# Patient Record
Sex: Female | Born: 1965
Health system: Southern US, Community
[De-identification: ages and names within clinical notes are randomized; demographics above are authoritative.]

## PROBLEM LIST (undated history)

## (undated) DIAGNOSIS — F909 Attention-deficit hyperactivity disorder, unspecified type: Secondary | ICD-10-CM

## (undated) DIAGNOSIS — M5126 Other intervertebral disc displacement, lumbar region: Secondary | ICD-10-CM

## (undated) HISTORY — PX: KIDNEY DONATION: SHX685

## (undated) HISTORY — DX: Other intervertebral disc displacement, lumbar region: M51.26

---

## 2000-04-18 ENCOUNTER — Other Ambulatory Visit: Admission: RE | Admit: 2000-04-18 | Discharge: 2000-04-18 | Payer: Self-pay | Admitting: Obstetrics and Gynecology

## 2000-10-30 ENCOUNTER — Inpatient Hospital Stay (HOSPITAL_COMMUNITY): Admission: AD | Admit: 2000-10-30 | Discharge: 2000-10-30 | Payer: Self-pay | Admitting: Obstetrics and Gynecology

## 2000-11-30 ENCOUNTER — Inpatient Hospital Stay (HOSPITAL_COMMUNITY): Admission: AD | Admit: 2000-11-30 | Discharge: 2000-12-02 | Payer: Self-pay | Admitting: Obstetrics & Gynecology

## 2001-01-23 ENCOUNTER — Ambulatory Visit: Admission: AD | Admit: 2001-01-23 | Discharge: 2001-01-23 | Payer: Self-pay | Admitting: Obstetrics & Gynecology

## 2002-03-20 ENCOUNTER — Other Ambulatory Visit: Admission: RE | Admit: 2002-03-20 | Discharge: 2002-03-20 | Payer: Self-pay | Admitting: Obstetrics and Gynecology

## 2003-01-06 ENCOUNTER — Emergency Department (HOSPITAL_COMMUNITY): Admission: EM | Admit: 2003-01-06 | Discharge: 2003-01-06 | Payer: Self-pay | Admitting: *Deleted

## 2003-01-06 ENCOUNTER — Encounter: Payer: Self-pay | Admitting: *Deleted

## 2003-01-07 ENCOUNTER — Encounter: Payer: Self-pay | Admitting: Orthopaedic Surgery

## 2003-01-07 ENCOUNTER — Ambulatory Visit (HOSPITAL_COMMUNITY): Admission: RE | Admit: 2003-01-07 | Discharge: 2003-01-07 | Payer: Self-pay | Admitting: Orthopaedic Surgery

## 2004-10-29 ENCOUNTER — Emergency Department (HOSPITAL_COMMUNITY): Admission: EM | Admit: 2004-10-29 | Discharge: 2004-10-29 | Payer: Self-pay | Admitting: Emergency Medicine

## 2007-02-20 ENCOUNTER — Emergency Department (HOSPITAL_COMMUNITY): Admission: EM | Admit: 2007-02-20 | Discharge: 2007-02-20 | Payer: Self-pay | Admitting: Emergency Medicine

## 2009-07-22 ENCOUNTER — Other Ambulatory Visit: Admission: RE | Admit: 2009-07-22 | Discharge: 2009-07-22 | Payer: Self-pay | Admitting: Obstetrics and Gynecology

## 2009-12-23 ENCOUNTER — Ambulatory Visit (HOSPITAL_COMMUNITY): Admission: RE | Admit: 2009-12-23 | Discharge: 2009-12-23 | Payer: Self-pay | Admitting: Advanced Practice Midwife

## 2010-12-04 NOTE — Op Note (Signed)
[facility]  Patient:    Caitlin Cooper, Caitlin Cooper                      MRN: [id number] Proc. Date: 01/23/01 Adm. Date:  [id number] Attending:  Lars Pinks                           Operative Report  PREOPERATIVE DIAGNOSIS:       Voluntary sterilization.  POSTOPERATIVE DIAGNOSIS:      Voluntary sterilization.  OPERATION:                    Laparoscopic bilateral tubal cautery.  SURGEON:                      Richard D. Arlyce Dice, M.D.  ANESTHESIA:                   General endotracheal anesthesia.  ESTIMATED BLOOD LOSS:         20 cc.  FINDINGS:                     Normal-appearing tubes and ovaries, normal uterus, normal cul-de-sac.  No evidence of endometriosis or pelvic adhesions. There were some adhesions in the right lower quadrant involving the small bowel.  The appendix could not be visualized.  INDICATIONS:                  This is a 45 year old, gravida 3, para 2, who delivered spontaneously in May and elected to have permanent sterilization. The options of reversible birth control were discussed with the patient as well as the failure rate of 5:1000.  The patient elected to proceed.  DESCRIPTION OF PROCEDURE:     The patient was taken to the operating room, placed in supine position, and general endotracheal anesthesia was induced. She was placed in the modified dorsolithotomy position, and the abdomen and vagina were prepped and draped in a sterile fashion.  The bladder was emptied. An attempt was made to place the Veress needle in the cul-de-sac to create a pneumoperitoneum, but this failed.  The surgeon regloved, and the Veress needle was placed into the umbilicus into the peritoneal cavity, and pneumoperitoneum was created easily.  Following this, a 5 mm trocar was introduced through the umbilicus, and a 5 mm scope was then place under direct visualization, and 5 mm accessory instrument was placed through a suprapubic stab wound.   The pelvis was viewed with the findings noted above.  The Kleppinger forceps were used to grasp the fallopian tube, and it was cauterized along a 3 to 4 cm length; 1.5 cm of normal tube was left proximal to the burn site.  The identical procedure was then carried out on the contralateral tube.   The procedure was then terminated.  Gas was allowed to escape.  The suprapubic wound was closed with Steri-Strips, and the umbilical wound was left unsutured.  The patient tolerated the procedure well and left the operating room in good condition. DD:  01/23/01 TD:  01/23/01 Job: [id number]

## 2012-01-26 ENCOUNTER — Other Ambulatory Visit (HOSPITAL_COMMUNITY)
Admission: RE | Admit: 2012-01-26 | Discharge: 2012-01-26 | Disposition: A | Discharge status: Home / Self Care | Payer: BC Managed Care – PPO | Source: Ambulatory Visit | Attending: Family Medicine | Admitting: Family Medicine

## 2012-01-26 ENCOUNTER — Other Ambulatory Visit: Payer: Self-pay | Admitting: Family Medicine

## 2012-01-26 DIAGNOSIS — Z124 Encounter for screening for malignant neoplasm of cervix: Secondary | ICD-10-CM | POA: Insufficient documentation

## 2012-01-26 DIAGNOSIS — Z113 Encounter for screening for infections with a predominantly sexual mode of transmission: Secondary | ICD-10-CM | POA: Insufficient documentation

## 2013-02-02 ENCOUNTER — Other Ambulatory Visit: Payer: Self-pay | Admitting: Family Medicine

## 2013-02-02 DIAGNOSIS — Z1231 Encounter for screening mammogram for malignant neoplasm of breast: Secondary | ICD-10-CM

## 2013-02-23 ENCOUNTER — Ambulatory Visit
Admission: RE | Admit: 2013-02-23 | Discharge: 2013-02-23 | Disposition: A | Discharge status: Home / Self Care | Payer: Self-pay | Source: Ambulatory Visit | Attending: Family Medicine | Admitting: Family Medicine

## 2013-02-23 DIAGNOSIS — Z1231 Encounter for screening mammogram for malignant neoplasm of breast: Secondary | ICD-10-CM

## 2013-02-26 ENCOUNTER — Other Ambulatory Visit: Payer: Self-pay | Admitting: Family Medicine

## 2013-02-26 DIAGNOSIS — R928 Other abnormal and inconclusive findings on diagnostic imaging of breast: Secondary | ICD-10-CM

## 2013-03-05 ENCOUNTER — Ambulatory Visit
Admission: RE | Admit: 2013-03-05 | Discharge: 2013-03-05 | Disposition: A | Discharge status: Home / Self Care | Payer: BC Managed Care – PPO | Source: Ambulatory Visit | Attending: Family Medicine | Admitting: Family Medicine

## 2013-03-05 DIAGNOSIS — R928 Other abnormal and inconclusive findings on diagnostic imaging of breast: Secondary | ICD-10-CM

## 2013-08-13 ENCOUNTER — Other Ambulatory Visit: Payer: Self-pay | Admitting: Family Medicine

## 2013-08-13 DIAGNOSIS — N63 Unspecified lump in unspecified breast: Secondary | ICD-10-CM

## 2013-09-06 ENCOUNTER — Inpatient Hospital Stay: Admission: RE | Admit: 2013-09-06 | Payer: BC Managed Care – PPO | Source: Ambulatory Visit

## 2013-09-11 ENCOUNTER — Other Ambulatory Visit: Payer: BC Managed Care – PPO

## 2013-09-17 ENCOUNTER — Ambulatory Visit
Admission: RE | Admit: 2013-09-17 | Discharge: 2013-09-17 | Disposition: A | Discharge status: Home / Self Care | Payer: BC Managed Care – PPO | Source: Ambulatory Visit | Attending: Family Medicine | Admitting: Family Medicine

## 2013-09-17 DIAGNOSIS — N63 Unspecified lump in unspecified breast: Secondary | ICD-10-CM

## 2014-01-29 ENCOUNTER — Emergency Department (HOSPITAL_COMMUNITY)
Admission: EM | Admit: 2014-01-29 | Discharge: 2014-01-29 | Disposition: A | Discharge status: Home / Self Care | Payer: BC Managed Care – PPO | Attending: Emergency Medicine | Admitting: Emergency Medicine

## 2014-01-29 ENCOUNTER — Encounter (HOSPITAL_COMMUNITY): Payer: Self-pay | Admitting: Emergency Medicine

## 2014-01-29 DIAGNOSIS — Z91040 Latex allergy status: Secondary | ICD-10-CM | POA: Insufficient documentation

## 2014-01-29 DIAGNOSIS — F909 Attention-deficit hyperactivity disorder, unspecified type: Secondary | ICD-10-CM | POA: Insufficient documentation

## 2014-01-29 DIAGNOSIS — M545 Low back pain, unspecified: Secondary | ICD-10-CM

## 2014-01-29 DIAGNOSIS — Z791 Long term (current) use of non-steroidal anti-inflammatories (NSAID): Secondary | ICD-10-CM | POA: Diagnosis not present

## 2014-01-29 DIAGNOSIS — Z79899 Other long term (current) drug therapy: Secondary | ICD-10-CM | POA: Diagnosis not present

## 2014-01-29 HISTORY — DX: Attention-deficit hyperactivity disorder, unspecified type: F90.9

## 2014-01-29 LAB — CBG MONITORING, ED: GLUCOSE-CAPILLARY: 134 mg/dL — AB (ref 70–99)

## 2014-01-29 MED ORDER — SODIUM CHLORIDE 0.9 % IV SOLN
Freq: Once | INTRAVENOUS | Status: AC
  Administered 2014-01-29: 08:00:00 via INTRAVENOUS

## 2014-01-29 MED ORDER — DIAZEPAM 5 MG PO TABS: ORAL_TABLET | ORAL | Status: DC

## 2014-01-29 MED ORDER — DIAZEPAM 5 MG PO TABS
5.0000 mg | ORAL_TABLET | Freq: Once | ORAL | Status: AC
  Administered 2014-01-29: 5 mg via ORAL
  Filled 2014-01-29: qty 1

## 2014-01-29 MED ORDER — IBUPROFEN 800 MG PO TABS: 800.0000 mg | ORAL_TABLET | Freq: Three times a day (TID) | ORAL | Status: DC

## 2014-01-29 MED ORDER — HYDROCODONE-ACETAMINOPHEN 5-325 MG PO TABS: 1.0000 | ORAL_TABLET | ORAL | Status: DC | PRN

## 2014-01-29 NOTE — ED Notes (Signed)
Pt called for help to assist pt from chair to bed.  Reports lower back spasms. Nehemiah Settle, [place] aware

## 2014-01-29 NOTE — ED Provider Notes (Signed)
CSN: [id number]     Arrival date & time 01/29/14  0802 History   First MD Initiated Contact with Patient 01/29/14 [phone]     Chief Complaint  Patient presents with  . Back Pain     (Consider location/radiation/quality/duration/timing/severity/associated sxs/prior Treatment) Patient is a 48 y.o. female presenting with back pain. The history is provided by the patient. No language interpreter was used.  Back Pain Location:  Lumbar spine Radiates to:  Does not radiate Pain severity:  Moderate Associated symptoms: no abdominal pain, no fever, no headaches, no numbness and no weakness   Associated symptoms comment:  48yo female presents with low back pain which started on Thursday after playing lacrosse earlier in the week.  Pain has become more severe since Thursday.  Pain is described as a sharp, shooting, and now constant pain.  It is made worse with movement, coughing, and inspiration.  Pain does cause nausea.  Pt tried both Aleve, Advil, and heat with no relief.  Pain was improved some with ice.  10/10 on pain scale but is currently improving with pain medication.  Hx of bulging disc x 15 yrs but pt states this does not cause chronic pain.   Pt describes a tingling sensation in her bilateral  lower extremities but denies loss of sensation.  Denies loss of bowel or bladder function.  Denies chest, abd pain, and vomiting.        Past Medical History  Diagnosis Date  . ADHD (attention deficit hyperactivity disorder)    History reviewed. No pertinent past surgical history. History reviewed. No pertinent family history. History  Substance Use Topics  . Smoking status: Never Smoker   . Smokeless tobacco: Not on file  . Alcohol Use: No   OB History   Grav Para Term Preterm Abortions TAB SAB Ect Mult Living                 Review of Systems  Constitutional: Negative for fever and chills.  Respiratory: Negative.   Cardiovascular: Negative.   Gastrointestinal: Negative.  Negative for  nausea and abdominal pain.  Genitourinary: Negative.   Musculoskeletal: Positive for back pain.  Skin: Negative.   Neurological: Negative.  Negative for weakness, numbness and headaches.      Allergies  Latex  Home Medications   Prior to Admission medications   Medication Sig Start Date End Date Taking? Authorizing Provider  Elderberry 575 MG/5ML SYRP Take 15-30 mLs by mouth every 4 (four) hours as needed (for cough).   Yes Historical Provider, MD  ibuprofen (ADVIL,MOTRIN) 200 MG tablet Take 600 mg by mouth every 6 (six) hours as needed for moderate pain.   Yes Historical Provider, MD  lisdexamfetamine (VYVANSE) 30 MG capsule Take 30 mg by mouth daily.   Yes Historical Provider, MD  naproxen sodium (ANAPROX) 220 MG tablet Take 440 mg by mouth daily as needed (for pain).   Yes Historical Provider, MD   BP 128/74  Pulse 76  Temp(Src) 97.7 F (36.5 C) (Oral)  Resp 16  Ht 5\' 3"  (1.6 m)  Wt 181 lb (82.101 kg)  BMI 32.07 kg/m2  SpO2 100%  LMP 01/15/2014 Physical Exam  Constitutional: She is oriented to person, place, and time. She appears well-developed and well-nourished.  HENT:  Head: Normocephalic.  Neck: Normal range of motion. Neck supple.  Cardiovascular: Normal rate and regular rhythm.   Pulmonary/Chest: Effort normal and breath sounds normal.  Abdominal: Soft. Bowel sounds are normal. There is no tenderness. There is  no rebound and no guarding.  Musculoskeletal: Normal range of motion.  Mild lumbar and paralumbar tenderness without swelling or discoloration.  Neurological: She is alert and oriented to person, place, and time. She has normal reflexes. Coordination normal.  Skin: Skin is warm and dry. No rash noted.  Psychiatric: She has a normal mood and affect.    ED Course  Procedures (including critical care time) Labs Review Labs Reviewed  CBG MONITORING, ED - Abnormal; Notable for the following:    Glucose-Capillary 134 (*)    All other components within  normal limits    Imaging Review No results found.   EKG Interpretation None      MDM   Final diagnoses:  None    1. Low back pain  Pain is worse with movement, better with rest. Occurred after physical activity and worsened over time to become what is described as spasmodic pain. No neurologic red flags or deficits. Better with medication here. She is able to ambulate with assistance and with subjectively less pain. Stable for discharge.     Dewaine Oats, PA-C 01/29/14 1055

## 2014-01-29 NOTE — ED Notes (Signed)
Shari, PA at bedside. 

## 2014-01-29 NOTE — ED Notes (Signed)
Pt to department via EMS- pt reports a hx of bulging disc. States that last week she was playing outside with her daughter and hurt her back. States that she went to the MD yesterday and was feeling better. States that this morning that she was unable to move or sit up. Bp-114/70 Hr-60 164mcg fentanyl, 4mg  zofran. 20g right hand.

## 2014-01-29 NOTE — Discharge Instructions (Signed)
Back Injury Prevention Back injuries can be extremely painful and difficult to heal. After having one back injury, you are much more likely to experience another later on. It is important to learn how to avoid injuring or re-injuring your back. The following tips can help you to prevent a back injury. PHYSICAL FITNESS  Exercise regularly and try to develop good tone in your abdominal muscles. Your abdominal muscles provide a lot of the support needed by your back.  Do aerobic exercises (walking, jogging, biking, swimming) regularly.  Do exercises that increase balance and strength (tai chi, yoga) regularly. This can decrease your risk of falling and injuring your back.  Stretch before and after exercising.  Maintain a healthy weight. The more you weigh, the more stress is placed on your back. For every pound of weight, 10 times that amount of pressure is placed on the back. DIET  Talk to your caregiver about how much calcium and vitamin D you need per day. These nutrients help to prevent weakening of the bones (osteoporosis). Osteoporosis can cause broken (fractured) bones that lead to back pain.  Include good sources of calcium in your diet, such as dairy products, green, leafy vegetables, and products with calcium added (fortified).  Include good sources of vitamin D in your diet, such as milk and foods that are fortified with vitamin D.  Consider taking a nutritional supplement or a multivitamin if needed.  Stop smoking if you smoke. POSTURE  Sit and stand up straight. Avoid leaning forward when you sit or hunching over when you stand.  Choose chairs with good low back (lumbar) support.  If you work at a desk, sit close to your work so you do not need to lean over. Keep your chin tucked in. Keep your neck drawn back and elbows bent at a right angle. Your arms should look like the letter "L."  Sit high and close to the steering wheel when you drive. Add a lumbar support to your car  seat if needed.  Avoid sitting or standing in one position for too long. Take breaks to get up, stretch, and walk around at least once every hour. Take breaks if you are driving for long periods of time.  Sleep on your side with your knees slightly bent, or sleep on your back with a pillow under your knees. Do not sleep on your stomach. LIFTING, TWISTING, AND REACHING  Avoid heavy lifting, especially repetitive lifting. If you must do heavy lifting:  Stretch before lifting.  Work slowly.  Rest between lifts.  Use carts and dollies to move objects when possible.  Make several small trips instead of carrying 1 heavy load.  Ask for help when you need it.  Ask for help when moving big, awkward objects.  Follow these steps when lifting:  Stand with your feet shoulder-width apart.  Get as close to the object as you can. Do not try to pick up heavy objects that are far from your body.  Use handles or lifting straps if they are available.  Bend at your knees. Squat down, but keep your heels off the floor.  Keep your shoulders pulled back, your chin tucked in, and your back straight.  Lift the object slowly, tightening the muscles in your legs, abdomen, and buttocks. Keep the object as close to the center of your body as possible.  When you put a load down, use these same guidelines in reverse.  Do not:  Lift the object above your waist.  Twist at the waist while lifting or carrying a load. Move your feet if you need to turn, not your waist.  Bend over without bending at your knees.  Avoid reaching over your head, across a table, or for an object on a high surface. OTHER TIPS  Avoid wet floors and keep sidewalks clear of ice to prevent falls.  Do not sleep on a mattress that is too soft or too hard.  Keep items that are used frequently within easy reach.  Put heavier objects on shelves at waist level and lighter objects on lower or higher shelves.  Find ways to  decrease your stress, such as exercise, massage, or relaxation techniques. Stress can build up in your muscles. Tense muscles are more vulnerable to injury.  Seek treatment for depression or anxiety if needed. These conditions can increase your risk of developing back pain. SEEK MEDICAL CARE IF:  You injure your back.  You have questions about diet, exercise, or other ways to prevent back injuries. MAKE SURE YOU:  Understand these instructions.  Will watch your condition.  Will get help right away if you are not doing well or get worse. Document Released: 08/12/2004 Document Revised: 09/27/2011 Document Reviewed: 08/16/2011 [facility] Patient Information 2015 [place], [place]. This information is not intended to replace advice given to you by your health care provider. Make sure you discuss any questions you have with your health care provider. Muscle Cramps and Spasms Muscle cramps and spasms occur when a muscle or muscles tighten and you have no control over this tightening (involuntary muscle contraction). They are a common problem and can develop in any muscle. The most common place is in the calf muscles of the leg. Both muscle cramps and muscle spasms are involuntary muscle contractions, but they also have differences:   Muscle cramps are sporadic and painful. They may last a few seconds to a quarter of an hour. Muscle cramps are often more forceful and last longer than muscle spasms.  Muscle spasms may or may not be painful. They may also last just a few seconds or much longer. CAUSES  It is uncommon for cramps or spasms to be due to a serious underlying problem. In many cases, the cause of cramps or spasms is unknown. Some common causes are:   Overexertion.   Overuse from repetitive motions (doing the same thing over and over).   Remaining in a certain position for a long period of time.   Improper preparation, form, or technique while performing a sport or activity.    Dehydration.   Injury.   Side effects of some medicines.   Abnormally low levels of the salts and ions in your blood (electrolytes), especially potassium and calcium. This could happen if you are taking water pills (diuretics) or you are pregnant.  Some underlying medical problems can make it more likely to develop cramps or spasms. These include, but are not limited to:   Diabetes.   Parkinson disease.   Hormone disorders, such as thyroid problems.   Alcohol abuse.   Diseases specific to muscles, joints, and bones.   Blood vessel disease where not enough blood is getting to the muscles.  HOME CARE INSTRUCTIONS   Stay well hydrated. Drink enough water and fluids to keep your urine clear or pale yellow.  It may be helpful to massage, stretch, and relax the affected muscle.  For tight or tense muscles, use a warm towel, heating pad, or hot shower water directed to the affected area.  If you are sore or have pain after a cramp or spasm, applying ice to the affected area may relieve discomfort.  Put ice in a plastic bag.  Place a towel between your skin and the bag.  Leave the ice on for 15-20 minutes, 03-04 times a day.  Medicines used to treat a known cause of cramps or spasms may help reduce their frequency or severity. Only take over-the-counter or prescription medicines as directed by your caregiver. SEEK MEDICAL CARE IF:  Your cramps or spasms get more severe, more frequent, or do not improve over time.  MAKE SURE YOU:   Understand these instructions.  Will watch your condition.  Will get help right away if you are not doing well or get worse. Document Released: 12/25/2001 Document Revised: 10/30/2012 Document Reviewed: 06/21/2012 [facility] Patient Information 2015 [place], [place]. This information is not intended to replace advice given to you by your health care provider. Make sure you discuss any questions you have with your health care provider.

## 2014-01-29 NOTE — ED Notes (Addendum)
Pt assisted to side of bed first; then assisted to standing position with assistance.  Pt denies complaints. Sts feels better now after fluids. Pt ambulated to restroom with steady gait. Caitlin Cooper, [place] aware

## 2014-01-30 NOTE — ED Provider Notes (Signed)
Medical screening examination/treatment/procedure(s) were performed by non-physician practitioner and as supervising physician I was immediately available for consultation/collaboration.   EKG Interpretation None        Ephraim Hamburger, MD 01/30/14 1553

## 2015-06-12 ENCOUNTER — Other Ambulatory Visit: Payer: Self-pay | Admitting: Family Medicine

## 2015-06-12 ENCOUNTER — Other Ambulatory Visit (HOSPITAL_COMMUNITY)
Admission: RE | Admit: 2015-06-12 | Discharge: 2015-06-12 | Disposition: A | Discharge status: Home / Self Care | Payer: 59 | Source: Ambulatory Visit | Attending: Family Medicine | Admitting: Family Medicine

## 2015-06-12 DIAGNOSIS — Z1151 Encounter for screening for human papillomavirus (HPV): Secondary | ICD-10-CM | POA: Diagnosis present

## 2015-06-12 DIAGNOSIS — Z124 Encounter for screening for malignant neoplasm of cervix: Secondary | ICD-10-CM | POA: Insufficient documentation

## 2015-06-17 LAB — CYTOLOGY - PAP

## 2016-02-17 ENCOUNTER — Emergency Department (HOSPITAL_COMMUNITY): Payer: Medicaid Other

## 2016-02-17 ENCOUNTER — Emergency Department (HOSPITAL_COMMUNITY)
Admission: EM | Admit: 2016-02-17 | Discharge: 2016-02-17 | Disposition: A | Discharge status: Home / Self Care | Payer: Medicaid Other | Attending: Emergency Medicine | Admitting: Emergency Medicine

## 2016-02-17 ENCOUNTER — Encounter (HOSPITAL_COMMUNITY): Payer: Self-pay | Admitting: Emergency Medicine

## 2016-02-17 DIAGNOSIS — Z91040 Latex allergy status: Secondary | ICD-10-CM | POA: Diagnosis not present

## 2016-02-17 DIAGNOSIS — M549 Dorsalgia, unspecified: Secondary | ICD-10-CM

## 2016-02-17 DIAGNOSIS — M545 Low back pain: Secondary | ICD-10-CM | POA: Diagnosis present

## 2016-02-17 DIAGNOSIS — M5442 Lumbago with sciatica, left side: Secondary | ICD-10-CM

## 2016-02-17 DIAGNOSIS — Z791 Long term (current) use of non-steroidal anti-inflammatories (NSAID): Secondary | ICD-10-CM | POA: Insufficient documentation

## 2016-02-17 LAB — BASIC METABOLIC PANEL
Anion gap: 6 (ref 5–15)
BUN: 13 mg/dL (ref 6–20)
CALCIUM: 8.9 mg/dL (ref 8.9–10.3)
CO2: 25 mmol/L (ref 22–32)
CREATININE: 0.91 mg/dL (ref 0.44–1.00)
Chloride: 107 mmol/L (ref 101–111)
GFR calc Af Amer: >60 mL/min (ref >60)
GFR calc non Af Amer: >60 mL/min (ref >60)
GLUCOSE: 97 mg/dL (ref 65–99)
Potassium: 3.8 mmol/L (ref 3.5–5.1)
Sodium: 138 mmol/L (ref 135–145)

## 2016-02-17 LAB — CBC WITH DIFFERENTIAL/PLATELET
BASOS PCT: 1 %
Basophils Absolute: 0.1 10*3/uL (ref 0.0–0.1)
EOS ABS: 0 10*3/uL (ref 0.0–0.7)
Eosinophils Relative: 0 %
HCT: 31.1 % — ABNORMAL LOW (ref 36.0–46.0)
HEMOGLOBIN: 9.5 g/dL — AB (ref 12.0–15.0)
Lymphocytes Relative: 17 %
Lymphs Abs: 0.9 10*3/uL (ref 0.7–4.0)
MCH: 22.4 pg — AB (ref 26.0–34.0)
MCHC: 30.5 g/dL (ref 30.0–36.0)
MCV: 73.3 fL — AB (ref 78.0–100.0)
MONO ABS: 0.3 10*3/uL (ref 0.1–1.0)
Monocytes Relative: 6 %
Neutro Abs: 4.2 10*3/uL (ref 1.7–7.7)
Neutrophils Relative %: 76 %
PLATELETS: 422 10*3/uL — AB (ref 150–400)
RBC: 4.24 MIL/uL (ref 3.87–5.11)
RDW: 16.2 % — ABNORMAL HIGH (ref 11.5–15.5)
WBC: 5.5 10*3/uL (ref 4.0–10.5)

## 2016-02-17 LAB — URINALYSIS, ROUTINE W REFLEX MICROSCOPIC
BILIRUBIN URINE: NEGATIVE
Glucose, UA: NEGATIVE mg/dL
HGB URINE DIPSTICK: NEGATIVE
Ketones, ur: 15 mg/dL — AB
Leukocytes, UA: NEGATIVE
Nitrite: NEGATIVE
PROTEIN: NEGATIVE mg/dL
SPECIFIC GRAVITY, URINE: 1.009 (ref 1.005–1.030)
pH: 7.5 (ref 5.0–8.0)

## 2016-02-17 MED ORDER — MORPHINE SULFATE (PF) 4 MG/ML IV SOLN
4.0000 mg | Freq: Once | INTRAVENOUS | Status: AC
  Administered 2016-02-17: 4 mg via INTRAVENOUS
  Filled 2016-02-17: qty 1

## 2016-02-17 MED ORDER — METHOCARBAMOL 500 MG PO TABS: 500.0000 mg | ORAL_TABLET | Freq: Two times a day (BID) | ORAL | 0 refills | Status: DC | PRN

## 2016-02-17 MED ORDER — KETOROLAC TROMETHAMINE 30 MG/ML IJ SOLN
30.0000 mg | Freq: Once | INTRAMUSCULAR | Status: AC
  Administered 2016-02-17: 30 mg via INTRAVENOUS
  Filled 2016-02-17: qty 1

## 2016-02-17 MED ORDER — PREDNISONE 20 MG PO TABS: 40.0000 mg | ORAL_TABLET | Freq: Every day | ORAL | 0 refills | Status: DC

## 2016-02-17 MED ORDER — ONDANSETRON HCL 4 MG/2ML IJ SOLN
4.0000 mg | Freq: Once | INTRAMUSCULAR | Status: AC
  Administered 2016-02-17: 4 mg via INTRAVENOUS
  Filled 2016-02-17: qty 2

## 2016-02-17 MED ORDER — NAPROXEN 500 MG PO TABS
500.0000 mg | ORAL_TABLET | Freq: Two times a day (BID) | ORAL | 0 refills | Status: DC
Start: 2016-02-17 — End: 2017-10-13

## 2016-02-17 MED ORDER — DIAZEPAM 5 MG/ML IJ SOLN
5.0000 mg | Freq: Once | INTRAMUSCULAR | Status: AC
  Administered 2016-02-17: 5 mg via INTRAVENOUS
  Filled 2016-02-17: qty 2

## 2016-02-17 NOTE — ED Notes (Signed)
Bed: [facility] Expected date:  Expected time:  Means of arrival:  Comments: 50 yo F, Back Pain

## 2016-02-17 NOTE — ED Provider Notes (Signed)
[facility] DEPT Provider Note   CSN: [id number] Arrival date & time: 02/17/16  1437  First Provider Contact:  First MD Initiated Contact with Patient 02/17/16 1505        History   Chief Complaint Chief Complaint  Patient presents with  . Back Pain    HPI DENNIS LUGAR is a 50 y.o. female.  ORIA MAYERNIK is a 50 y.o. Female who presents to the ED via EMS complaining of low back pain that has worsened today. Patient reports she has a history of L4-5 herniated disk and usually has some back pain. She usually take ibuprofen for her pain. She reports today she was in the shower and reaching up when she began having worsening pain to her lumbar spine. She reports her pain is across her lumbar spine and can radiate down her posterior left leg. She took ibuprofen early this morning. Should 50 g of fentanyl via IV by EMS in route to the hospital. She reports her pain is improved by holding still. She reports if she moves her pain worsens. Patient denies fevers, urinary symptoms, hematuria, numbness, tingling, weakness, history of IV drug use, history of cancer, loss of bladder control, loss of bowel control, or falls.   The history is provided by the patient and medical records. No language interpreter was used.  Back Pain      Past Medical History:  Diagnosis Date  . ADHD (attention deficit hyperactivity disorder)     There are no active problems to display for this patient.   History reviewed. No pertinent surgical history.  OB History    No data available       Home Medications    Prior to Admission medications   Medication Sig Start Date End Date Taking? Authorizing Provider  ibuprofen (ADVIL,MOTRIN) 200 MG tablet Take 800 mg by mouth every 6 (six) hours as needed for moderate pain.    Yes Historical Provider, MD  VYVANSE 40 MG capsule Take 40 mg by mouth daily as needed (ADD/ADHD.).  11/19/15  Yes Historical Provider, MD  methocarbamol (ROBAXIN) 500 MG tablet Take  1 tablet (500 mg total) by mouth 2 (two) times daily as needed for muscle spasms. 02/17/16   Waynetta Pean, PA-C  naproxen (NAPROSYN) 500 MG tablet Take 1 tablet (500 mg total) by mouth 2 (two) times daily with a meal. 02/17/16   Waynetta Pean, PA-C  predniSONE (DELTASONE) 20 MG tablet Take 2 tablets (40 mg total) by mouth daily. 02/17/16   Waynetta Pean, PA-C    Family History No family history on file.  Social History Social History  Substance Use Topics  . Smoking status: Never Smoker  . Smokeless tobacco: Not on file  . Alcohol use No     Allergies   Latex   Review of Systems Review of Systems  Constitutional: Negative for chills and fever.  HENT: Negative for sore throat.   Eyes: Negative for visual disturbance.  Respiratory: Negative for cough and shortness of breath.   Cardiovascular: Negative for chest pain.  Gastrointestinal: Negative for abdominal pain, nausea and vomiting.  Genitourinary: Negative for difficulty urinating, dysuria, frequency and urgency.  Musculoskeletal: Positive for back pain. Negative for neck pain.  Skin: Negative for rash.  Neurological: Negative for weakness, numbness and headaches.     Physical Exam Updated Vital Signs BP 137/78 (BP Location: Left Arm)   Pulse 70   Temp 98.1 F (36.7 C) (Oral)   Resp 20   SpO2 98%  Physical Exam  Constitutional: She is oriented to person, place, and time. She appears well-developed and well-nourished. No distress.  Nontoxic appearing.  HENT:  Head: Normocephalic and atraumatic.  Right Ear: External ear normal.  Left Ear: External ear normal.  Eyes: Conjunctivae are normal. Pupils are equal, round, and reactive to light. Right eye exhibits no discharge. Left eye exhibits no discharge.  Neck: Neck supple.  Cardiovascular: Normal rate, regular rhythm, normal heart sounds and intact distal pulses.   Bilateral dorsalis pedis and posterior tibialis pulses are intact.  Pulmonary/Chest: Effort normal  and breath sounds normal. No respiratory distress.  Abdominal: Soft. There is no tenderness.  Musculoskeletal: Normal range of motion. She exhibits tenderness. She exhibits no edema or deformity.  Patient has tenderness across her lumbar spine. No midline back tenderness. No overlying skin changes. No back erythema, deformity, ecchymosis, edema or warmth. No LE edema or tenderness.  Lymphadenopathy:    She has no cervical adenopathy.  Neurological: She is alert and oriented to person, place, and time. She has normal reflexes. She displays normal reflexes. She exhibits normal muscle tone. Coordination normal.  Sensation is intact to her bilateral LE.  Bilateral patellar DTRs are intact. After pain control patient is able to ambulate with normal gait.  Skin: Skin is warm and dry. Capillary refill takes less than 2 seconds. No rash noted. She is not diaphoretic.  Psychiatric: She has a normal mood and affect. Her behavior is normal.  Nursing note and vitals reviewed.    ED Treatments / Results  Labs (all labs ordered are listed, but only abnormal results are displayed) Labs Reviewed  CBC WITH DIFFERENTIAL/PLATELET - Abnormal; Notable for the following:       Result Value   Hemoglobin 9.5 (*)    HCT 31.1 (*)    MCV 73.3 (*)    MCH 22.4 (*)    RDW 16.2 (*)    Platelets 422 (*)    All other components within normal limits  URINALYSIS, ROUTINE W REFLEX MICROSCOPIC (NOT AT [facility]) - Abnormal; Notable for the following:    Ketones, ur 15 (*)    All other components within normal limits  BASIC METABOLIC PANEL    EKG  EKG Interpretation None       Radiology Mr Lumbar Spine Wo Contrast  Result Date: 02/17/2016 CLINICAL DATA:  50 year old female with back pain. Initial encounter. EXAM: MRI LUMBAR SPINE WITHOUT CONTRAST TECHNIQUE: Multiplanar, multisequence MR imaging of the lumbar spine was performed. No intravenous contrast was administered. COMPARISON:  10/29/2004 plain film exam. No  comparison MR available for direct review. Report from 01/07/2003 MR is available. FINDINGS: Exam is motion degraded. Segmentation: Last fully open disk space is labeled L5-S1. Present examination incorporates from T11-12 disc space through the mid to lower sacrum. Alignment:  Normal. Vertebrae: Small hemangioma/ focal fatty deposit right L1 and left L3 vertebra. Remodeling of the S2 sacral foramen secondary to Tarlov cysts larger on the right. Conus medullaris: Extends to the low lying conus seen to the upper L3 level. There may be a tiny lipoma L4 level extending into the sacrum. No dysplastic posterior elements detected. Paraspinal and other soft tissues: No right kidney in the expected renal fossae. Full urinary bladder. Minimal irregularity uterus incompletely assessed. Disc levels: T11-12 through L1-2 unremarkable. L2-3: Minimal facet degenerative changes. Minimal disc desiccation. Minimal bulge. L3-4:  Minimal to mild bulge.  Mild facet bony overgrowth. L4-5:  Mild to moderate facet degenerative changes. L5-S1: Moderate facet degenerative changes  with bony overgrowth greater on the left. Disc degeneration with mild disc space narrowing and minimal bulge. Moderate size left paracentral caudally extending disc fragment with significant compression of the left S1 nerve root. Question if the patient has a left-sided radiculopathy? IMPRESSION: L5-S1 moderate size left paracentral caudally extending disc fragment with significant compression of the left S1 nerve root. Question if the patient has a left-sided S1 radiculopathy? Moderate facet degenerative changes greater on the left. Disc degeneration. L4-5 mild to moderate facet degenerative changes. L3-4 minimal to mild bulge.  Mild facet degenerative changes. Low lying conus seen to the upper L3 level. There may be a tiny lipoma L4 level extending into the sacrum. No dysplastic posterior elements detected. Remodeling of the S2 sacral foramen secondary to Tarlov  cysts larger on the right. No right kidney in the expected renal fossae. Electronically Signed   By: Genia Del M.D.   On: 02/17/2016 20:25    Procedures Procedures (including critical care time)  Medications Ordered in ED Medications  ketorolac (TORADOL) 30 MG/ML injection 30 mg (30 mg Intravenous Given 02/17/16 1541)  diazepam (VALIUM) injection 5 mg (5 mg Intravenous Given 02/17/16 1543)  morphine 4 MG/ML injection 4 mg (4 mg Intravenous Given 02/17/16 2016)  ondansetron (ZOFRAN) injection 4 mg (4 mg Intravenous Given 02/17/16 2016)     Initial Impression / Assessment and Plan / ED Course  I have reviewed the triage vital signs and the nursing notes.  Pertinent labs & imaging results that were available during my care of the patient were reviewed by me and considered in my medical decision making (see chart for details).  This is a 50 y.o. Female who presents to the ED via EMS complaining of low back pain that has worsened today. Patient reports she has a history of L4-5 herniated disk and usually has some back pain. She usually take ibuprofen for her pain. She reports today she was in the shower and reaching up when she began having worsening pain to her lumbar spine. She reports her pain is across her lumbar spine and can radiate down her posterior left leg. She took ibuprofen early this morning. She had 50 g of fentanyl via IV by EMS in route to the hospital. She reports her pain is improved by holding still. She reports if she moves her pain worsens. Later, patient reports that she is unable to urinate. She was placed on the bedpan and she was unable to urinate. She had an in and out catheter performed. Concern for cauda equina and will obtain MRI of her lumbar spine.  Clinical Course  Comment By Time  At recheck patient reports she is feeling much better after pain medication. She is able to cooperate more with exam. She still has not been able to urinate. She was given a bed pan but was  not able to urinate. MRI ordered and pending. We are attempting to find a room for the patient so we can do an in and out cath.  Waynetta Pean, PA-C 08/01 1630   On exam the patient is afebrile nontoxic appearing. She has no focal neurological deficits. After pain control she is able to ambulate with normal gait. She reports feeling much better after pain medicine of Toradol and Valium. MRI returned without evidence of cauda equina. It does show compression of the left S1 nerve root. Repeat exam patient has no neurological deficits to her lower extremity. She again is able to ambulate with normal gait. She ambulated  to the bathroom and had a post void residual of 0 MLS. She is not retaining urine. She is able to urinate on her own. No emergent findings on MRI of her lumbar spine. Will treat her for sciatica with Robaxin, naproxen and prednisone. I encouraged her to follow-up with primary care and provided her with follow-up instructions for a neurosurgeon for her back problems. Patient reports prior to discharge her pain is between a 0 and 1. She is ready for discharge. I advised the patient to follow-up with their primary care provider this week. I advised the patient to return to the emergency department with new or worsening symptoms or new concerns. The patient verbalized understanding and agreement with plan.    This patient was discussed with Dr. Wyvonnia Dusky who agrees with assessment and plan.   Final Clinical Impressions(s) / ED Diagnoses   Final diagnoses:  Back pain  Left-sided low back pain with left-sided sciatica    New Prescriptions Discharge Medication List as of 02/17/2016 10:32 PM    START taking these medications   Details  methocarbamol (ROBAXIN) 500 MG tablet Take 1 tablet (500 mg total) by mouth 2 (two) times daily as needed for muscle spasms., Starting Tue 02/17/2016, Print    naproxen (NAPROSYN) 500 MG tablet Take 1 tablet (500 mg total) by mouth 2 (two) times daily with a meal.,  Starting Tue 02/17/2016, Print    predniSONE (DELTASONE) 20 MG tablet Take 2 tablets (40 mg total) by mouth daily., Starting Tue 02/17/2016, Print         Waynetta Pean, PA-C 02/18/16 [id number]    Ezequiel Essex, MD 02/18/16 [phone]

## 2016-02-17 NOTE — ED Notes (Signed)
No respiratory or acute distress noted alert and oriented x 3 visitors at bedside call light in reach. 

## 2016-02-17 NOTE — ED Notes (Signed)
In MRI family at bedside in pt room.

## 2016-02-17 NOTE — ED Triage Notes (Addendum)
Per EMS pt comes from home for back pain in L4-5 where has herniated disc that increased today while reaching up in shower.  Patient given Fentanyl 54mcg via IV in route.  Vtals 148/92, HR 88, R 18, 98% ra

## 2016-02-17 NOTE — ED Notes (Signed)
Bladder scan with no urine in bladder.

## 2016-09-14 DIAGNOSIS — Z79899 Other long term (current) drug therapy: Secondary | ICD-10-CM | POA: Diagnosis not present

## 2016-09-14 DIAGNOSIS — F909 Attention-deficit hyperactivity disorder, unspecified type: Secondary | ICD-10-CM | POA: Diagnosis not present

## 2016-09-14 DIAGNOSIS — E669 Obesity, unspecified: Secondary | ICD-10-CM | POA: Diagnosis not present

## 2016-09-14 DIAGNOSIS — J4530 Mild persistent asthma, uncomplicated: Secondary | ICD-10-CM | POA: Diagnosis not present

## 2016-09-14 DIAGNOSIS — Z0000 Encounter for general adult medical examination without abnormal findings: Secondary | ICD-10-CM | POA: Diagnosis not present

## 2016-09-14 DIAGNOSIS — D500 Iron deficiency anemia secondary to blood loss (chronic): Secondary | ICD-10-CM | POA: Diagnosis not present

## 2016-09-16 ENCOUNTER — Other Ambulatory Visit: Payer: Self-pay | Admitting: Family Medicine

## 2016-09-16 DIAGNOSIS — D241 Benign neoplasm of right breast: Secondary | ICD-10-CM

## 2017-09-16 ENCOUNTER — Emergency Department (HOSPITAL_COMMUNITY): Payer: BLUE CROSS/BLUE SHIELD

## 2017-09-16 ENCOUNTER — Encounter (HOSPITAL_COMMUNITY): Payer: Self-pay

## 2017-09-16 ENCOUNTER — Emergency Department (HOSPITAL_COMMUNITY)
Admission: EM | Admit: 2017-09-16 | Discharge: 2017-09-16 | Disposition: A | Discharge status: Home / Self Care | Payer: BLUE CROSS/BLUE SHIELD | Attending: Emergency Medicine | Admitting: Emergency Medicine

## 2017-09-16 ENCOUNTER — Other Ambulatory Visit: Payer: Self-pay

## 2017-09-16 DIAGNOSIS — R072 Precordial pain: Secondary | ICD-10-CM | POA: Diagnosis not present

## 2017-09-16 DIAGNOSIS — R609 Edema, unspecified: Secondary | ICD-10-CM | POA: Diagnosis not present

## 2017-09-16 DIAGNOSIS — R0602 Shortness of breath: Secondary | ICD-10-CM

## 2017-09-16 DIAGNOSIS — G8929 Other chronic pain: Secondary | ICD-10-CM | POA: Diagnosis not present

## 2017-09-16 DIAGNOSIS — R600 Localized edema: Secondary | ICD-10-CM | POA: Diagnosis not present

## 2017-09-16 DIAGNOSIS — R079 Chest pain, unspecified: Secondary | ICD-10-CM | POA: Diagnosis not present

## 2017-09-16 DIAGNOSIS — Z79899 Other long term (current) drug therapy: Secondary | ICD-10-CM | POA: Insufficient documentation

## 2017-09-16 LAB — BASIC METABOLIC PANEL
Anion gap: 8 (ref 5–15)
BUN: 8 mg/dL (ref 6–20)
CO2: 21 mmol/L — ABNORMAL LOW (ref 22–32)
Calcium: 8.9 mg/dL (ref 8.9–10.3)
Chloride: 105 mmol/L (ref 101–111)
Creatinine, Ser: 0.84 mg/dL (ref 0.44–1.00)
GFR calc Af Amer: >60 mL/min (ref >60)
Glucose, Bld: 97 mg/dL (ref 65–99)
POTASSIUM: 4.7 mmol/L (ref 3.5–5.1)
SODIUM: 134 mmol/L — AB (ref 135–145)

## 2017-09-16 LAB — CBC
HEMATOCRIT: 29.1 % — AB (ref 36.0–46.0)
Hemoglobin: 8.4 g/dL — ABNORMAL LOW (ref 12.0–15.0)
MCH: 20.5 pg — ABNORMAL LOW (ref 26.0–34.0)
MCHC: 28.9 g/dL — ABNORMAL LOW (ref 30.0–36.0)
MCV: 71.1 fL — ABNORMAL LOW (ref 78.0–100.0)
Platelets: 456 10*3/uL — ABNORMAL HIGH (ref 150–400)
RBC: 4.09 MIL/uL (ref 3.87–5.11)
RDW: 18.3 % — ABNORMAL HIGH (ref 11.5–15.5)
WBC: 5.6 10*3/uL (ref 4.0–10.5)

## 2017-09-16 LAB — I-STAT TROPONIN, ED
Troponin i, poc: 0 ng/mL (ref 0.00–0.08)
Troponin i, poc: 0 ng/mL (ref 0.00–0.08)

## 2017-09-16 LAB — BRAIN NATRIURETIC PEPTIDE: B NATRIURETIC PEPTIDE 5: 17.1 pg/mL (ref 0.0–100.0)

## 2017-09-16 LAB — I-STAT BETA HCG BLOOD, ED (MC, WL, AP ONLY): I-stat hCG, quantitative: <5 m[IU]/mL (ref <5)

## 2017-09-16 LAB — D-DIMER, QUANTITATIVE (NOT AT ARMC): D DIMER QUANT: 1.24 ug{FEU}/mL — AB (ref 0.00–0.50)

## 2017-09-16 MED ORDER — IOPAMIDOL (ISOVUE-370) INJECTION 76%
INTRAVENOUS | Status: AC
  Administered 2017-09-16: 100 mL
  Filled 2017-09-16: qty 100

## 2017-09-16 NOTE — ED Provider Notes (Signed)
[facility] EMERGENCY DEPARTMENT Provider Note   CSN: [id number] Arrival date & time: 09/16/17  1132     History   Chief Complaint Chief Complaint  Patient presents with  . Leg Swelling    HPI Caitlin Cooper is a 52 y.o. female.  She states for the last few days she has had bilateral leg swelling from her knees down to her feet.  It is worse at the end of the day and improves in the morning.  It has caused her to have to buy different shoes due to swelling.  She also had an episode yesterday while shopping of some left-sided chest pain that lasted for the entire time shopping and resolved with rest.  She states she has had this chest pain before but has never lasted this long and has had a stress test and echo but probably 3-5 years ago.  She called her primary care doctor about the symptoms and some shortness of breath that she has been experiencing and was told to go to the emergency department.  Currently she is complaining of the swelling but does not chest pain.  The history is provided by the patient.  Chest Pain   This is a recurrent problem. The current episode started yesterday. The problem has been resolved. The pain is associated with walking. The pain is present in the substernal region. The pain is moderate. The quality of the pain is described as pressure-like. The pain does not radiate. Associated symptoms include back pain (chronic), leg pain, lower extremity edema and shortness of breath. Pertinent negatives include no abdominal pain, no cough, no diaphoresis, no fever, no hemoptysis, no nausea, no numbness, no palpitations, no sputum production, no syncope, no vomiting and no weakness. She has tried rest for the symptoms. The treatment provided significant relief.  Pertinent negatives for past medical history include no seizures.    Past Medical History:  Diagnosis Date  . ADHD (attention deficit hyperactivity disorder)     There are no active  problems to display for this patient.   Past Surgical History:  Procedure Laterality Date  . KIDNEY DONATION      OB History    No data available       Home Medications    Prior to Admission medications   Medication Sig Start Date End Date Taking? Authorizing Provider  ibuprofen (ADVIL,MOTRIN) 200 MG tablet Take 800 mg by mouth every 6 (six) hours as needed for moderate pain.     [provider]  methocarbamol (ROBAXIN) 500 MG tablet Take 1 tablet (500 mg total) by mouth 2 (two) times daily as needed for muscle spasms. 02/17/16   Waynetta Pean, PA-C  naproxen (NAPROSYN) 500 MG tablet Take 1 tablet (500 mg total) by mouth 2 (two) times daily with a meal. 02/17/16   Waynetta Pean, PA-C  predniSONE (DELTASONE) 20 MG tablet Take 2 tablets (40 mg total) by mouth daily. 02/17/16   Waynetta Pean, PA-C  VYVANSE 40 MG capsule Take 40 mg by mouth daily as needed (ADD/ADHD.).  11/19/15   [provider]    Family History No family history on file.  Social History Social History   Tobacco Use  . Smoking status: Never Smoker  . Smokeless tobacco: Never Used  Substance Use Topics  . Alcohol use: No  . Drug use: No     Allergies   Latex   Review of Systems Review of Systems  Constitutional: Negative for chills, diaphoresis and fever.  HENT: Negative for ear pain and sore throat.   Eyes: Negative for pain and visual disturbance.  Respiratory: Positive for shortness of breath. Negative for cough, hemoptysis and sputum production.   Cardiovascular: Positive for chest pain. Negative for palpitations and syncope.  Gastrointestinal: Negative for abdominal pain, nausea and vomiting.  Genitourinary: Negative for dysuria and hematuria.       Kidney donation  Musculoskeletal: Positive for back pain (chronic). Negative for arthralgias.  Skin: Negative for color change and rash.  Neurological: Negative for seizures, syncope, weakness and numbness.  All other systems  reviewed and are negative.    Physical Exam Updated Vital Signs BP (!) 143/84 (BP Location: Right Arm)   Pulse 91   Temp 99.1 F (37.3 C) (Oral)   Resp 18   Ht 5\' 3"  (1.6 m)   Wt 99.3 kg (219 lb)   LMP 09/12/2017   SpO2 100%   BMI 38.79 kg/m   Physical Exam  Constitutional: She appears well-developed and well-nourished. No distress.  HENT:  Head: Normocephalic and atraumatic.  Mouth/Throat: No oropharyngeal exudate.  Eyes: Conjunctivae are normal. Right eye exhibits no discharge. Left eye exhibits no discharge.  Neck: Neck supple. No JVD present.  Cardiovascular: Normal rate, regular rhythm and normal heart sounds.  No murmur heard. Pulmonary/Chest: Effort normal and breath sounds normal. No respiratory distress.  Abdominal: Soft. There is no tenderness.  Musculoskeletal: She exhibits edema (trace edema bilat lower ext). She exhibits no tenderness.  Neurological: She is alert.  Skin: Skin is warm and dry. Capillary refill takes less than 2 seconds.  Psychiatric: She has a normal mood and affect.  Nursing note and vitals reviewed.    ED Treatments / Results  Labs (all labs ordered are listed, but only abnormal results are displayed) Labs Reviewed  BASIC METABOLIC PANEL - Abnormal; Notable for the following components:      Result Value   Sodium 134 (*)    CO2 21 (*)    All other components within normal limits  CBC - Abnormal; Notable for the following components:   Hemoglobin 8.4 (*)    HCT 29.1 (*)    MCV 71.1 (*)    MCH 20.5 (*)    MCHC 28.9 (*)    RDW 18.3 (*)    Platelets 456 (*)    All other components within normal limits  D-DIMER, QUANTITATIVE (NOT AT [facility])  I-STAT TROPONIN, ED  I-STAT BETA HCG BLOOD, ED ([facility], [facility], [facility] ONLY)  I-STAT TROPONIN, ED    EKG  EKG Interpretation  Date/Time:  Friday September 16 2017 11:53:36 EST Ventricular Rate:  87 PR Interval:  152 QRS Duration: 80 QT Interval:  368 QTC Calculation: 442 R Axis:   67 Text  Interpretation:  Normal sinus rhythm Cannot rule out Anterior infarct , age undetermined Abnormal ECG no prior Confirmed by Aletta Edouard [phone]) on 09/16/2017 4:00:44 PM       Radiology Dg Chest 2 View  Result Date: 09/16/2017 CLINICAL DATA:  Shortness of breath, chest pain since yesterday, fluid retention EXAM: CHEST  2 VIEW COMPARISON:  08/28/2009 FINDINGS: Normal heart size, mediastinal contours, and pulmonary vascularity. Lungs clear. No pleural effusion or pneumothorax. Bones unremarkable. IMPRESSION: Normal exam. Electronically Signed   By: Lavonia Dana M.D.   On: 09/16/2017 12:20   Ct Angio Chest Pe W/cm &/or Wo Cm  Result Date: 09/16/2017 CLINICAL DATA:  51 year old female with shortness of breath, chest pain and lower extremity swelling for 2 days. Solitary  kidney, prior kidney donor. EXAM: CT ANGIOGRAPHY CHEST WITH CONTRAST TECHNIQUE: Multidetector CT imaging of the chest was performed using the standard protocol during bolus administration of intravenous contrast. Multiplanar CT image reconstructions and MIPs were obtained to evaluate the vascular anatomy. CONTRAST:  77 milliliters ISOVUE-370 IOPAMIDOL (ISOVUE-370) INJECTION 76% COMPARISON:  Chest radiographs 09/16/2017 and earlier. FINDINGS: Cardiovascular: Adequate contrast bolus timing in the pulmonary arterial tree. No focal filling defect identified in the pulmonary arteries to suggest acute pulmonary embolism. Cardiac size is at the upper limits of normal to mildly enlarged. No pericardial effusion. Negative visible aorta. Mediastinum/Nodes: Negative.  No mediastinal lymphadenopathy. Lungs/Pleura: Major airways are patent. Mild dependent pulmonary ground-glass opacity most resembles atelectasis. Otherwise negative lung parenchyma. No pleural effusion. Upper Abdomen: Negative visible liver, gallbladder, spleen, pancreas, adrenal glands, left kidney, and bowel in the upper abdomen. Musculoskeletal: Negative. Review of the MIP images confirms  the above findings. IMPRESSION: Negative for acute pulmonary embolus. Negative chest CTA aside from borderline to mild cardiomegaly. Electronically Signed   By: Genevie Ann M.D.   On: 09/16/2017 20:09    Procedures Procedures (including critical care time)  Medications Ordered in ED Medications - No data to display   Initial Impression / Assessment and Plan / ED Course  I have reviewed the triage vital signs and the nursing notes.  Pertinent labs & imaging results that were available during my care of the patient were reviewed by me and considered in my medical decision making (see chart for details).  Clinical Course as of Sep 18 1103  Fri Sep 16, 2017  2050 Reviewed patient's results with her.  CT PE study is negative.  She understands the need to follow-up with her primary care doctor.  [MB]    Clinical Course User Index [MB] Hayden Rasmussen, MD      Final Clinical Impressions(s) / ED Diagnoses   Final diagnoses:  Peripheral edema  Chest pain, unspecified type  Shortness of breath    ED Discharge Orders    None       Hayden Rasmussen, MD 09/17/17 1105

## 2017-09-16 NOTE — ED Notes (Signed)
Patient ambulatory to bathroom with steady gait at this time 

## 2017-09-16 NOTE — ED Triage Notes (Signed)
Per Pt, Pt is coming from home with complaints of bilateral leg swelling along with hypertension. Pt reports an intermittent chest pain with vision change yesterday. Pt reports that it reduced overnight. Reports some swelling still noted in her legs and lower back pain.

## 2017-09-16 NOTE — ED Notes (Signed)
Attempted IV X2 this RN, Advertising copywriter also attempted X1

## 2017-09-16 NOTE — Discharge Instructions (Signed)
Your evaluated in the emergency department for swelling in the legs associated with some chest pain and shortness of breath.  Your workup did not show an obvious cause of the symptoms.  You were slightly more anemic than your baseline and he will need to follow-up with your primary care doctor regarding all of these symptoms.  Please return to the emergency department if any worsening.

## 2017-09-16 NOTE — ED Notes (Signed)
IV Team at bedside 

## 2017-09-16 NOTE — ED Notes (Signed)
Pt requested to have BP cuff removed

## 2017-09-19 DIAGNOSIS — Z0000 Encounter for general adult medical examination without abnormal findings: Secondary | ICD-10-CM | POA: Diagnosis not present

## 2017-09-19 DIAGNOSIS — D500 Iron deficiency anemia secondary to blood loss (chronic): Secondary | ICD-10-CM | POA: Diagnosis not present

## 2017-09-19 DIAGNOSIS — E669 Obesity, unspecified: Secondary | ICD-10-CM | POA: Diagnosis not present

## 2017-09-19 DIAGNOSIS — Z13220 Encounter for screening for lipoid disorders: Secondary | ICD-10-CM | POA: Diagnosis not present

## 2017-09-19 DIAGNOSIS — R0609 Other forms of dyspnea: Secondary | ICD-10-CM | POA: Diagnosis not present

## 2017-09-26 ENCOUNTER — Telehealth: Payer: Self-pay | Admitting: Cardiology

## 2017-09-26 NOTE — Telephone Encounter (Signed)
Closed Encounter  °

## 2017-09-27 ENCOUNTER — Telehealth: Payer: Self-pay | Admitting: Cardiology

## 2017-09-27 DIAGNOSIS — D500 Iron deficiency anemia secondary to blood loss (chronic): Secondary | ICD-10-CM | POA: Diagnosis not present

## 2017-09-27 NOTE — Telephone Encounter (Signed)
Received incoming records from [place] at [facility] for upcoming appointment on 10/13/17 @ 3:20pm with Dr. Percival Spanish. Records located in Medical Records. 09/27/17 ab

## 2017-09-28 ENCOUNTER — Other Ambulatory Visit (HOSPITAL_COMMUNITY): Payer: Self-pay | Admitting: *Deleted

## 2017-09-29 ENCOUNTER — Ambulatory Visit (HOSPITAL_COMMUNITY)
Admission: RE | Admit: 2017-09-29 | Discharge: 2017-09-29 | Disposition: A | Discharge status: Home / Self Care | Payer: BLUE CROSS/BLUE SHIELD | Source: Ambulatory Visit | Attending: Family Medicine | Admitting: Family Medicine

## 2017-09-29 DIAGNOSIS — D509 Iron deficiency anemia, unspecified: Secondary | ICD-10-CM | POA: Diagnosis not present

## 2017-09-29 MED ORDER — SODIUM CHLORIDE 0.9 % IV SOLN
510.0000 mg | INTRAVENOUS | Status: DC
  Administered 2017-09-29: 510 mg via INTRAVENOUS
  Filled 2017-09-29: qty 17

## 2017-09-29 NOTE — Discharge Instructions (Signed)

## 2017-09-30 ENCOUNTER — Encounter (HOSPITAL_COMMUNITY): Payer: BLUE CROSS/BLUE SHIELD

## 2017-10-03 ENCOUNTER — Ambulatory Visit: Payer: BLUE CROSS/BLUE SHIELD | Admitting: Cardiology

## 2017-10-05 ENCOUNTER — Encounter: Payer: Self-pay | Admitting: Cardiology

## 2017-10-06 ENCOUNTER — Emergency Department (HOSPITAL_COMMUNITY)
Admission: EM | Admit: 2017-10-06 | Discharge: 2017-10-06 | Disposition: A | Discharge status: Home / Self Care | Payer: BLUE CROSS/BLUE SHIELD | Attending: Emergency Medicine | Admitting: Emergency Medicine

## 2017-10-06 ENCOUNTER — Other Ambulatory Visit: Payer: Self-pay

## 2017-10-06 ENCOUNTER — Ambulatory Visit (HOSPITAL_COMMUNITY)
Admission: RE | Admit: 2017-10-06 | Discharge: 2017-10-06 | Disposition: A | Discharge status: Home / Self Care | Payer: BLUE CROSS/BLUE SHIELD | Source: Ambulatory Visit | Attending: Family Medicine | Admitting: Family Medicine

## 2017-10-06 ENCOUNTER — Encounter (HOSPITAL_COMMUNITY): Payer: Self-pay | Admitting: *Deleted

## 2017-10-06 DIAGNOSIS — Z91040 Latex allergy status: Secondary | ICD-10-CM | POA: Insufficient documentation

## 2017-10-06 DIAGNOSIS — R531 Weakness: Secondary | ICD-10-CM | POA: Diagnosis not present

## 2017-10-06 DIAGNOSIS — D509 Iron deficiency anemia, unspecified: Secondary | ICD-10-CM | POA: Diagnosis not present

## 2017-10-06 DIAGNOSIS — R110 Nausea: Secondary | ICD-10-CM | POA: Diagnosis not present

## 2017-10-06 DIAGNOSIS — F909 Attention-deficit hyperactivity disorder, unspecified type: Secondary | ICD-10-CM | POA: Insufficient documentation

## 2017-10-06 DIAGNOSIS — R55 Syncope and collapse: Secondary | ICD-10-CM | POA: Insufficient documentation

## 2017-10-06 DIAGNOSIS — Z79899 Other long term (current) drug therapy: Secondary | ICD-10-CM | POA: Diagnosis not present

## 2017-10-06 LAB — URINALYSIS, ROUTINE W REFLEX MICROSCOPIC
BILIRUBIN URINE: NEGATIVE
GLUCOSE, UA: NEGATIVE mg/dL
Hgb urine dipstick: NEGATIVE
Ketones, ur: NEGATIVE mg/dL
Leukocytes, UA: NEGATIVE
NITRITE: NEGATIVE
PH: 6 (ref 5.0–8.0)
Protein, ur: NEGATIVE mg/dL
SPECIFIC GRAVITY, URINE: 1.012 (ref 1.005–1.030)

## 2017-10-06 LAB — CBC
HEMATOCRIT: 31.5 % — AB (ref 36.0–46.0)
HEMOGLOBIN: 9.2 g/dL — AB (ref 12.0–15.0)
MCH: 21.8 pg — AB (ref 26.0–34.0)
MCHC: 29.2 g/dL — AB (ref 30.0–36.0)
MCV: 74.6 fL — AB (ref 78.0–100.0)
Platelets: 446 10*3/uL — ABNORMAL HIGH (ref 150–400)
RBC: 4.22 MIL/uL (ref 3.87–5.11)
RDW: 22.3 % — AB (ref 11.5–15.5)
WBC: 8.2 10*3/uL (ref 4.0–10.5)

## 2017-10-06 LAB — BASIC METABOLIC PANEL
ANION GAP: 10 (ref 5–15)
BUN: 23 mg/dL — AB (ref 6–20)
CHLORIDE: 106 mmol/L (ref 101–111)
CO2: 19 mmol/L — AB (ref 22–32)
Calcium: 9.1 mg/dL (ref 8.9–10.3)
Creatinine, Ser: 0.99 mg/dL (ref 0.44–1.00)
GFR calc Af Amer: >60 mL/min (ref >60)
GLUCOSE: 116 mg/dL — AB (ref 65–99)
POTASSIUM: 4 mmol/L (ref 3.5–5.1)
Sodium: 135 mmol/L (ref 135–145)

## 2017-10-06 LAB — HCG, QUANTITATIVE, PREGNANCY: hCG, Beta Chain, Quant, S: <1 m[IU]/mL (ref <5)

## 2017-10-06 MED ORDER — SODIUM CHLORIDE 0.9 % IV SOLN: INTRAVENOUS | Status: DC

## 2017-10-06 MED ORDER — FERUMOXYTOL INJECTION 510 MG/17 ML
510.0000 mg | INTRAVENOUS | Status: DC
  Administered 2017-10-06: 510 mg via INTRAVENOUS
  Filled 2017-10-06: qty 17

## 2017-10-06 MED ORDER — SODIUM CHLORIDE 0.9 % IV BOLUS (SEPSIS)
1000.0000 mL | Freq: Once | INTRAVENOUS | Status: AC
  Administered 2017-10-06: 1000 mL via INTRAVENOUS

## 2017-10-06 NOTE — ED Notes (Signed)
Pt ambulated in hallway approx 1023ft. Pt c/o SOB and fatigue with walking but able to complete walk independently. Sat 96-100% on RA. VSS.

## 2017-10-06 NOTE — Progress Notes (Signed)
PT came in today for her second dose of Feraheme.  5 minutes into getting her infusion she started having a reaction.  She was sweaty and she turned gray in color.  She looked as if the had a seizure for a couple of minutes.  Her eyes were rolled back in her head and her legs were sticking straight put in the air.  When she came she came back around she was complaining of severe pain all over.  We called the ED and sent the patient by stretcher to the ED.  I called and talked to the Charge nurse.  I also called Dr Raul Del office and they recommended that we take her to the ED because of the seizure like activity .

## 2017-10-06 NOTE — ED Triage Notes (Signed)
Pt in from medical day care receiving iron infusion, pt had syncopal episode while receiving transfusion, states she feels nauseous and week, had infusion last week without issue, pt alert and oriented on arrival, noted to be pale

## 2017-10-06 NOTE — ED Provider Notes (Signed)
[facility] EMERGENCY DEPARTMENT Provider Note   CSN: [id number] Arrival date & time: 10/06/17  9371     History   Chief Complaint Chief Complaint  Patient presents with  . Near Syncope    HPI Caitlin Cooper is a 52 y.o. female  The history is provided by the patient. No language interpreter was used.  Loss of Consciousness   This is a recurrent (Has occurred in the past when overheated) problem. The current episode started less than 1 hour ago. The problem occurs rarely. The problem has been gradually improving. She lost consciousness for a period of less than one minute. Associated with: IV infusion. Associated symptoms include diaphoresis, malaise/fatigue, nausea and weakness (global). Pertinent negatives include abdominal pain, bladder incontinence, bowel incontinence, chest pain, confusion, fever, focal sensory loss, focal weakness, palpitations, seizures, slurred speech and vomiting. Her past medical history does not include CAD, CVA, DM, HTN, seizures, TIA or vertigo. Past medical history comments: Positive for anemia requiring transfusion.    Past Medical History:  Diagnosis Date  . ADHD (attention deficit hyperactivity disorder)     There are no active problems to display for this patient.   Past Surgical History:  Procedure Laterality Date  . KIDNEY DONATION      OB History   None      Home Medications    Prior to Admission medications   Medication Sig Start Date End Date Taking? Authorizing Provider  ibuprofen (ADVIL,MOTRIN) 200 MG tablet Take 800 mg by mouth every 6 (six) hours as needed for moderate pain.     [provider]  methocarbamol (ROBAXIN) 500 MG tablet Take 1 tablet (500 mg total) by mouth 2 (two) times daily as needed for muscle spasms. 02/17/16   Waynetta Pean, PA-C  naproxen (NAPROSYN) 500 MG tablet Take 1 tablet (500 mg total) by mouth 2 (two) times daily with a meal. 02/17/16   Waynetta Pean, PA-C  predniSONE  (DELTASONE) 20 MG tablet Take 2 tablets (40 mg total) by mouth daily. 02/17/16   Waynetta Pean, PA-C  VYVANSE 40 MG capsule Take 40 mg by mouth daily as needed (ADD/ADHD.).  11/19/15   [provider]    Family History History reviewed. No pertinent family history.  Social History Social History   Tobacco Use  . Smoking status: Never Smoker  . Smokeless tobacco: Never Used  Substance Use Topics  . Alcohol use: No  . Drug use: No     Allergies   Latex   Review of Systems Review of Systems  Constitutional: Positive for diaphoresis and malaise/fatigue. Negative for fever.  HENT: Negative.   Eyes: Negative.   Respiratory: Negative.   Cardiovascular: Positive for syncope. Negative for chest pain and palpitations.  Gastrointestinal: Positive for nausea. Negative for abdominal pain, bowel incontinence and vomiting.  Genitourinary: Negative.  Negative for bladder incontinence.  Musculoskeletal: Negative.   Skin: Negative.   Neurological: Positive for weakness (global). Negative for focal weakness and seizures.  Psychiatric/Behavioral: Negative for confusion.     Physical Exam Updated Vital Signs BP (!) 109/55 (BP Location: Right Arm)   Pulse 72   Temp 98.5 F (36.9 C) (Oral)   Resp 20   LMP 09/12/2017   SpO2 100%   Physical Exam  Constitutional: She is oriented to person, place, and time. She appears well-developed and well-nourished. No distress.  Pale, appears like she feels unwell  HENT:  Head: Normocephalic and atraumatic.  Eyes: Pupils are equal, round, and reactive  to light. Conjunctivae and EOM are normal. No scleral icterus.  Neck: Normal range of motion.  Cardiovascular: Normal rate, regular rhythm and normal heart sounds. Exam reveals no gallop and no friction rub.  No murmur heard. Pulmonary/Chest: Effort normal and breath sounds normal. No respiratory distress.  Abdominal: Soft. Bowel sounds are normal. She exhibits no distension and no mass.  There is no tenderness. There is no guarding.  Musculoskeletal: Normal range of motion.  Neurological: She is alert and oriented to person, place, and time.  Skin: Skin is warm and dry. She is not diaphoretic.  Psychiatric: Her behavior is normal.  Nursing note and vitals reviewed.    ED Treatments / Results  Labs (all labs ordered are listed, but only abnormal results are displayed) Labs Reviewed  BASIC METABOLIC PANEL  CBC  URINALYSIS, ROUTINE W REFLEX MICROSCOPIC  I-STAT BETA HCG BLOOD, ED ([facility], [facility], [facility] ONLY)  I-STAT BETA HCG BLOOD, ED ([facility], [facility], [facility] ONLY)  CBG MONITORING, ED    EKG  EKG Interpretation  Date/Time:  Thursday October 06 2017 09:28:01 EDT Ventricular Rate:  77 PR Interval:    QRS Duration: 73 QT Interval:  421 QTC Calculation: 477 R Axis:   67 Text Interpretation:  Sinus rhythm No significant change since last tracing Confirmed by Merrily Pew [phone]) on 10/06/2017 9:44:48 AM       Radiology No results found.  Procedures Procedures (including critical care time)  Medications Ordered in ED Medications  sodium chloride 0.9 % bolus 1,000 mL (1,000 mLs Intravenous New Bag/Given 10/06/17 0929)    And  0.9 %  sodium chloride infusion (has no administration in time range)     Initial Impression / Assessment and Plan / ED Course  I have reviewed the triage vital signs and the nursing notes.  Pertinent labs & imaging results that were available during my care of the patient were reviewed by me and considered in my medical decision making (see chart for details).  Clinical Course as of Oct 07 2109  Thu Oct 06, 2017  1007 HGB is improved from previous  Hemoglobin(!): 9.2 [AH]  1007 Patient receiving IV fluids   [AH]  1007 Patient EKG is  with normal rate, rhythm, and no QT prolongation   [AH]  1204 Orthostatics negative    [AH]    Clinical Course User Index [AH] Margarita Mail, PA-C   52 year old female brought in from infusion center by nursing  staff.  She had a syncopal event today while receiving iron transfusion for chronic anemia.  Patient states that when they placed the IV she immediately began to feel somewhat nauseated, diaphoretic she had an uneasy feeling in her stomach.  She states that she states that she called the nurse call bell and then does not remember anything until she was awoken by nursing staff who is Marc Morgans her face.  She had no confusion after waking.  The nurse who brought her down was concerned for some potential seizure-like activity however she has no previous history of seizures, no loss of bowel or bladder continence, no tongue biting or other mouth injuries and had no postictal state.  She does have a previous history of syncope in the past generally when she feels overheated.  She denies a previous history of vasovagal syncope.  He does have very heavy periods which may have been the cause of her anemia.  She denies any recent heavy bleeding, melena or hematochezia.  She denies palpitations or Isyncope without prodrome.  labs, EKG, reviewed by me.  She is not orthostatic, she is not feeling dizzy or weak with ambulation.  Her hemoglobin is better than it was 2 weeks ago.  Her orthostatic vital signs are negative.  Feel she had a vasovagal syncope event.  She appears appropriate for discharge at this time and to follow closely with her PCP.    Final Clinical Impressions(s) / ED Diagnoses   Final diagnoses:  Vasovagal syncope    ED Discharge Orders    None       Margarita Mail, PA-C 10/07/17 2112    Mesner, Corene Cornea, MD 10/10/17 [phone]

## 2017-10-06 NOTE — Discharge Instructions (Addendum)
Get help right away if: A fainting spell leads to an injury or bleeding. You have new symptoms that occur with the fainting spells, such as: Shortness of breath. Chest pain. Irregular heartbeat. You twitch or make jerky movements for more than 5 minutes. You twitch or make jerky movements during more than one fainting spell.

## 2017-10-12 NOTE — Progress Notes (Signed)
Cardiology Office Note   Date:  10/13/2017   ID:  Caitlin Cooper, DOB 07-Aug-1965, MRN [id number]  PCP:  Mayra Neer, MD  Cardiologist:   No primary care provider on file. Referring:  Mayra Neer, MD  Chief Complaint  Patient presents with  . Shortness of Breath  . Chest Pain      History of Present Illness: Caitlin Cooper is a 52 y.o. female who presents for follow up of swelling.  She was in the emergency room in early March for this.  She had some symptoms of swelling in her legs.  She said she gained 10 pounds overnight.  She felt a tightness from her legs all the way up her chest.  She had some discomfort in her neck.  Frank pain in her chest seem to last only for a few minutes.  She had an episode of her vision changing like she was looking inward which was transitory while this was going on.  She presented to the emergency room and I reviewed these records.  There was no objective evidence of ischemia.  EKG was essentially unremarkable.  She had a CT which was negative for any evidence of pulmonary embolism.   She also had syncope during recent iron infusion.Marland Kitchen  She was in the ED earlier this month follow this.  I reviewed these records for this visit.   She had syncope and this was thought to be vasovagal.     Of note the patient's only other cardiac history is positive for having a stress test some years ago which sounds like a stress echocardiogram.  Apparently this was unremarkable but I cannot find this.   Past Medical History:  Diagnosis Date  . ADHD (attention deficit hyperactivity disorder)   . Lumbar herniated disc     Past Surgical History:  Procedure Laterality Date  . KIDNEY DONATION       No current outpatient medications on file.   No current facility-administered medications for this visit.     Allergies:   Latex    Social History:  The patient  reports that she has never smoked. She has never used smokeless tobacco. She reports that she  does not drink alcohol or use drugs.   Family History:  The patient's family history includes CAD (age of onset: 34) in her father; Cancer in her maternal grandmother; Diabetes in her father and paternal grandmother; Heart attack in her paternal grandfather and paternal grandmother; Heart disease in her brother and paternal grandmother; Hypertension in her paternal grandmother; IgA nephropathy in her brother; Other (age of onset: 34) in her maternal grandfather; Stroke in her paternal grandmother.    ROS:  Please see the history of present illness.   Otherwise, review of systems are positive for none.   All other systems are reviewed and negative.    PHYSICAL EXAM: VS:  BP 126/83 (BP Location: Right Arm)   Pulse (!) 106   Ht 5\' 3"  (1.6 m)   Wt 219 lb 6.4 oz (99.5 kg)   SpO2 99%   BMI 38.86 kg/m  , BMI Body mass index is 38.86 kg/m. GENERAL:  Well appearing HEENT:  Pupils equal round and reactive, fundi not visualized, oral mucosa unremarkable NECK:  No jugular venous distention, waveform within normal limits, carotid upstroke brisk and symmetric, no bruits, no thyromegaly LYMPHATICS:  No cervical, inguinal adenopathy LUNGS:  Clear to auscultation bilaterally BACK:  No CVA tenderness CHEST:  Unremarkable HEART:  PMI not  displaced or sustained,S1 and S2 within normal limits, no S3, no S4, no clicks, no rubs, no murmurs ABD:  Flat, positive bowel sounds normal in frequency in pitch, no bruits, no rebound, no guarding, no midline pulsatile mass, no hepatomegaly, no splenomegaly EXT:  2 plus pulses throughout, no edema, no cyanosis no clubbing SKIN:  No rashes no nodules NEURO:  Cranial nerves II through XII grossly intact, motor grossly intact throughout PSYCH:  Cognitively intact, oriented to person place and time    EKG:  EKG is ordered today. The ekg ordered today demonstrates sinus tachycardia, rate 100, axis within normal limits, intervals within normal limits, no acute ST-T wave  changes.   Recent Labs: 09/16/2017: B Natriuretic Peptide 17.1 10/06/2017: BUN 23; Creatinine, Ser 0.99; Hemoglobin 9.2; Platelets 446; Potassium 4.0; Sodium 135    Lipid Panel No results found for: CHOL, TRIG, HDL, CHOLHDL, VLDL, LDLCALC, LDLDIRECT    Wt Readings from Last 3 Encounters:  10/13/17 219 lb 6.4 oz (99.5 kg)  10/06/17 215 lb (97.5 kg)  09/16/17 219 lb (99.3 kg)      Other studies Reviewed: Additional studies/ records that were reviewed today include: ED records.   Primary care office notes.  Review of the above records demonstrates:  Please see elsewhere in the note.     ASSESSMENT AND PLAN:  SYNCOPE: Etiology of this appears to be vasovagal and she is not had any further episodes.  No change in therapy is indicated.  CHEST PAIN:   This pain was atypical.  However, given her family history I will be screening her. I will bring the patient back for a POET (Plain Old Exercise Test). This will allow me to screen for obstructive coronary disease, risk stratify and very importantly provide a prescription for exercise.  SWELLING: She has had some edema and shortness of breath.  I will start with a BNP level and have a low threshold for an echocardiogram.  TACHYCARDIA: This appears to be sinus tachycardia.  She had a slightly elevated  TSH earlier this month.  Other labs were unremarkable.  She is somewhat anemic but she says she is been having a couple of menstrual periods in the last month and this may be contributing overall to the symptoms.   She is having iron deficiency anemia treated.   Current medicines are reviewed at length with the patient today.  The patient does not have concerns regarding medicines.  The following changes have been made:  no change  Labs/ tests ordered today include:   Orders Placed This Encounter  Procedures  . B Nat Peptide  . EXERCISE TOLERANCE TEST (ETT)     Disposition:   FU with me after the above testing.     Signed, Minus Breeding, MD  10/13/2017 5:24 PM    [place]

## 2017-10-13 ENCOUNTER — Encounter: Payer: Self-pay | Admitting: Cardiology

## 2017-10-13 ENCOUNTER — Ambulatory Visit (INDEPENDENT_AMBULATORY_CARE_PROVIDER_SITE_OTHER): Payer: BLUE CROSS/BLUE SHIELD | Admitting: Cardiology

## 2017-10-13 ENCOUNTER — Ambulatory Visit: Payer: BLUE CROSS/BLUE SHIELD | Admitting: Cardiology

## 2017-10-13 VITALS — BP 126/83 | HR 106 | Ht 63.0 in | Wt 219.4 lb

## 2017-10-13 DIAGNOSIS — R079 Chest pain, unspecified: Secondary | ICD-10-CM | POA: Diagnosis not present

## 2017-10-13 DIAGNOSIS — R000 Tachycardia, unspecified: Secondary | ICD-10-CM | POA: Insufficient documentation

## 2017-10-13 DIAGNOSIS — M7989 Other specified soft tissue disorders: Secondary | ICD-10-CM | POA: Insufficient documentation

## 2017-10-13 DIAGNOSIS — R0602 Shortness of breath: Secondary | ICD-10-CM | POA: Diagnosis not present

## 2017-10-13 NOTE — Patient Instructions (Signed)
Medication Instructions:  Continue current medications  If you need a refill on your cardiac medications before your next appointment, please call your pharmacy.  Labwork: BNP HERE IN OUR OFFICE AT [facility]  Take the provided lab slips for you to take with you to the lab for you blood draw.   You will NOT need to fast   Testing/Procedures: Your physician has requested that you have an exercise tolerance test. For further information please visit [email or web address]. Please also follow instruction sheet, as given.   Follow-Up: Your physician wants you to follow-up in: After Stress Test.   Thank you for choosing [facility] at [facility]!!

## 2017-10-14 NOTE — Addendum Note (Signed)
Addended by: Vennie Homans on: 10/14/2017 10:26 AM   Modules accepted: Orders

## 2017-10-25 ENCOUNTER — Telehealth (HOSPITAL_COMMUNITY): Payer: Self-pay

## 2017-10-25 NOTE — Telephone Encounter (Signed)
Encounter complete. 

## 2017-10-27 ENCOUNTER — Ambulatory Visit (HOSPITAL_COMMUNITY)
Admission: RE | Admit: 2017-10-27 | Discharge: 2017-10-27 | Disposition: A | Discharge status: Home / Self Care | Payer: BLUE CROSS/BLUE SHIELD | Source: Ambulatory Visit | Attending: Cardiology | Admitting: Cardiology

## 2017-10-27 DIAGNOSIS — R0602 Shortness of breath: Secondary | ICD-10-CM | POA: Diagnosis not present

## 2017-10-27 LAB — EXERCISE TOLERANCE TEST
CSEPED: 7 min
CSEPEDS: 1 s
CSEPHR: 94 %
Estimated workload: 8.5 METS
MPHR: 169 {beats}/min
Peak HR: 160 {beats}/min
RPE: 18
Rest HR: 77 {beats}/min

## 2017-11-06 NOTE — Progress Notes (Signed)
Cardiology Office Note   Date:  11/07/2017   ID:  EZELLA KELL, DOB June 22, 1966, MRN [id number]  PCP:  Mayra Neer, MD  Cardiologist:   No primary care provider on file.   Chief Complaint  Patient presents with  . Chest Pain      History of Present Illness: Caitlin Cooper is a 52 y.o. female who presents for follow up of syncope.  She also had SOB.  I saw her recently and sent her for a POET (Plain Old Exercise Treadmill).   This was negative for ischemia.  Since then she is continued to have shortness of breath with activities.  She not had any further syncope.  She says her heart rate has been in the 80s which is what it is today and that is more typical for her than the 106 we had previously.  She still has edema in her feet frequently.  She denies any PND or orthopnea.  She has some left sided chest discomfort that is similar to what she had when she had her CT to rule out pulmonary embolism but not as severe.   Past Medical History:  Diagnosis Date  . ADHD (attention deficit hyperactivity disorder)   . Lumbar herniated disc     Past Surgical History:  Procedure Laterality Date  . KIDNEY DONATION       No current outpatient medications on file.   No current facility-administered medications for this visit.     Allergies:   Latex     ROS:  Please see the history of present illness.   Otherwise, review of systems are positive for none.   All other systems are reviewed and negative.    PHYSICAL EXAM: VS:  BP 130/77   Pulse 82   Ht 5\' 3"  (1.6 m)   Wt 218 lb 9.6 oz (99.2 kg)   BMI 38.72 kg/m  , BMI Body mass index is 38.72 kg/m. GENERAL:  Well appearing HEENT:  Pupils equal round and reactive, fundi not visualized, oral mucosa unremarkable NECK:  No jugular venous distention, waveform within normal limits, carotid upstroke brisk and symmetric, no bruits, no thyromegaly LUNGS:  Clear to auscultation bilaterally HEART:  PMI not displaced or sustained,S1  and S2 within normal limits, no S3, no S4, no clicks, no rubs, no murmurs ABD:  Flat, positive bowel sounds normal in frequency in pitch, no bruits, no rebound, no guarding, no midline pulsatile mass, no hepatomegaly, no splenomegaly EXT:  2 plus pulses throughout, no edema, no cyanosis no clubbing    EKG:  EKG is not ordered today.   Recent Labs: 09/16/2017: B Natriuretic Peptide 17.1 10/06/2017: BUN 23; Creatinine, Ser 0.99; Hemoglobin 9.2; Platelets 446; Potassium 4.0; Sodium 135    Lipid Panel No results found for: CHOL, TRIG, HDL, CHOLHDL, VLDL, LDLCALC, LDLDIRECT    Wt Readings from Last 3 Encounters:  11/07/17 218 lb 9.6 oz (99.2 kg)  10/13/17 219 lb 6.4 oz (99.5 kg)  10/06/17 215 lb (97.5 kg)      Other studies Reviewed: Additional studies/ records that were reviewed today include: POET (Plain Old Exercise Treadmill). Review of the above records demonstrates:  Please see elsewhere in the note.     ASSESSMENT AND PLAN:  SYNCOPE:     She has had no further episodes and this was likely vagal.  No further work up.    CHEST PAIN:     This was atypical and she had a negative POET (Plain Old  Exercise Treadmill).  I will refer her back to her PCP.   SWELLING:    I will check a BNP and echo.  If these are normal.  No further work up.    TACHYCARDIA:     This is improved.  She did have a rapid heart rate during the first stage of exercise but it was sinus tachycardia.  I think this is related to weight and deconditioning.   She also had anemia treated.  DYSPNEA:  This will be evaluated as above.    Current medicines are reviewed at length with the patient today.  The patient does not have concerns regarding medicines.  The following changes have been made:  no change  Labs/ tests ordered today include:   Orders Placed This Encounter  Procedures  . ECHOCARDIOGRAM COMPLETE     Disposition:   FU with me as needed.      Signed, Minus Breeding, MD  11/07/2017 9:47 AM     [place]

## 2017-11-07 ENCOUNTER — Encounter: Payer: Self-pay | Admitting: Cardiology

## 2017-11-07 ENCOUNTER — Ambulatory Visit (INDEPENDENT_AMBULATORY_CARE_PROVIDER_SITE_OTHER): Payer: BLUE CROSS/BLUE SHIELD | Admitting: Cardiology

## 2017-11-07 VITALS — BP 130/77 | HR 82 | Ht 63.0 in | Wt 218.6 lb

## 2017-11-07 DIAGNOSIS — R0602 Shortness of breath: Secondary | ICD-10-CM | POA: Diagnosis not present

## 2017-11-07 DIAGNOSIS — M7989 Other specified soft tissue disorders: Secondary | ICD-10-CM

## 2017-11-07 DIAGNOSIS — R079 Chest pain, unspecified: Secondary | ICD-10-CM | POA: Diagnosis not present

## 2017-11-07 NOTE — Patient Instructions (Signed)
Medication Instructions:  Continue current medications  If you need a refill on your cardiac medications before your next appointment, please call your pharmacy.  Labwork: None Ordered   Testing/Procedures: Your physician has requested that you have an echocardiogram. Echocardiography is a painless test that uses sound waves to create images of your heart. It provides your doctor with information about the size and shape of your heart and how well your heart's chambers and valves are working. This procedure takes approximately one hour. There are no restrictions for this procedure.   Follow-Up: Your physician wants you to follow-up in: As Needed.     Thank you for choosing [facility] at [facility]!!       

## 2017-11-08 LAB — BRAIN NATRIURETIC PEPTIDE: BNP: 3 pg/mL (ref 0.0–100.0)

## 2017-11-17 ENCOUNTER — Ambulatory Visit (HOSPITAL_COMMUNITY): Payer: BLUE CROSS/BLUE SHIELD | Attending: Cardiology

## 2017-11-17 ENCOUNTER — Other Ambulatory Visit: Payer: Self-pay

## 2017-11-17 DIAGNOSIS — R55 Syncope and collapse: Secondary | ICD-10-CM | POA: Diagnosis not present

## 2017-11-17 DIAGNOSIS — M5126 Other intervertebral disc displacement, lumbar region: Secondary | ICD-10-CM | POA: Diagnosis not present

## 2017-11-17 DIAGNOSIS — R0602 Shortness of breath: Secondary | ICD-10-CM | POA: Diagnosis not present

## 2017-11-17 DIAGNOSIS — F909 Attention-deficit hyperactivity disorder, unspecified type: Secondary | ICD-10-CM | POA: Diagnosis not present

## 2017-11-17 DIAGNOSIS — R0789 Other chest pain: Secondary | ICD-10-CM | POA: Insufficient documentation

## 2017-11-17 NOTE — Progress Notes (Signed)
The patient did not give consent to the use of Definity at this time. This was a technically difficult study due to inabilty to position the patient and due to patient discomfort.

## 2017-11-23 ENCOUNTER — Telehealth: Payer: Self-pay | Admitting: *Deleted

## 2017-11-23 DIAGNOSIS — R0602 Shortness of breath: Secondary | ICD-10-CM

## 2017-11-23 NOTE — Telephone Encounter (Signed)
Echo order for 2 yrs

## 2017-11-23 NOTE — Telephone Encounter (Signed)
-----   Message from Minus Breeding, MD sent at 11/20/2017  4:50 PM EDT ----- There is very mild basal septal hypertrophy which is likely normal variant but I would like to see her in a couple of years to consider another echo.  No further work up at this time however.  Call Ms. Tocci with the results and send results to Mayra Neer, MD

## 2017-12-06 DIAGNOSIS — M542 Cervicalgia: Secondary | ICD-10-CM | POA: Diagnosis not present

## 2017-12-06 DIAGNOSIS — M7502 Adhesive capsulitis of left shoulder: Secondary | ICD-10-CM | POA: Diagnosis not present

## 2018-01-02 DIAGNOSIS — B349 Viral infection, unspecified: Secondary | ICD-10-CM | POA: Diagnosis not present

## 2018-01-05 DIAGNOSIS — M25512 Pain in left shoulder: Secondary | ICD-10-CM | POA: Diagnosis not present

## 2018-01-05 DIAGNOSIS — M7502 Adhesive capsulitis of left shoulder: Secondary | ICD-10-CM | POA: Diagnosis not present

## 2018-01-05 DIAGNOSIS — M25612 Stiffness of left shoulder, not elsewhere classified: Secondary | ICD-10-CM | POA: Diagnosis not present

## 2018-01-09 DIAGNOSIS — M25612 Stiffness of left shoulder, not elsewhere classified: Secondary | ICD-10-CM | POA: Diagnosis not present

## 2018-01-09 DIAGNOSIS — M7502 Adhesive capsulitis of left shoulder: Secondary | ICD-10-CM | POA: Diagnosis not present

## 2018-01-09 DIAGNOSIS — M25512 Pain in left shoulder: Secondary | ICD-10-CM | POA: Diagnosis not present

## 2018-01-10 DIAGNOSIS — M542 Cervicalgia: Secondary | ICD-10-CM | POA: Diagnosis not present

## 2018-01-11 DIAGNOSIS — M7502 Adhesive capsulitis of left shoulder: Secondary | ICD-10-CM | POA: Diagnosis not present

## 2018-01-11 DIAGNOSIS — M25512 Pain in left shoulder: Secondary | ICD-10-CM | POA: Diagnosis not present

## 2018-01-11 DIAGNOSIS — M25612 Stiffness of left shoulder, not elsewhere classified: Secondary | ICD-10-CM | POA: Diagnosis not present

## 2018-01-13 DIAGNOSIS — M25512 Pain in left shoulder: Secondary | ICD-10-CM | POA: Diagnosis not present

## 2018-01-13 DIAGNOSIS — M25612 Stiffness of left shoulder, not elsewhere classified: Secondary | ICD-10-CM | POA: Diagnosis not present

## 2018-01-13 DIAGNOSIS — M7502 Adhesive capsulitis of left shoulder: Secondary | ICD-10-CM | POA: Diagnosis not present

## 2018-01-17 DIAGNOSIS — M25512 Pain in left shoulder: Secondary | ICD-10-CM | POA: Diagnosis not present

## 2018-01-17 DIAGNOSIS — M25612 Stiffness of left shoulder, not elsewhere classified: Secondary | ICD-10-CM | POA: Diagnosis not present

## 2018-01-17 DIAGNOSIS — M7502 Adhesive capsulitis of left shoulder: Secondary | ICD-10-CM | POA: Diagnosis not present

## 2018-01-20 DIAGNOSIS — M7502 Adhesive capsulitis of left shoulder: Secondary | ICD-10-CM | POA: Diagnosis not present

## 2018-01-20 DIAGNOSIS — M25512 Pain in left shoulder: Secondary | ICD-10-CM | POA: Diagnosis not present

## 2018-01-20 DIAGNOSIS — M25612 Stiffness of left shoulder, not elsewhere classified: Secondary | ICD-10-CM | POA: Diagnosis not present

## 2018-01-23 DIAGNOSIS — M25512 Pain in left shoulder: Secondary | ICD-10-CM | POA: Diagnosis not present

## 2018-01-23 DIAGNOSIS — M7502 Adhesive capsulitis of left shoulder: Secondary | ICD-10-CM | POA: Diagnosis not present

## 2018-01-23 DIAGNOSIS — M25612 Stiffness of left shoulder, not elsewhere classified: Secondary | ICD-10-CM | POA: Diagnosis not present

## 2018-01-25 DIAGNOSIS — M25512 Pain in left shoulder: Secondary | ICD-10-CM | POA: Diagnosis not present

## 2018-01-25 DIAGNOSIS — M25612 Stiffness of left shoulder, not elsewhere classified: Secondary | ICD-10-CM | POA: Diagnosis not present

## 2018-01-25 DIAGNOSIS — M7502 Adhesive capsulitis of left shoulder: Secondary | ICD-10-CM | POA: Diagnosis not present

## 2018-01-27 DIAGNOSIS — M7502 Adhesive capsulitis of left shoulder: Secondary | ICD-10-CM | POA: Diagnosis not present

## 2018-01-27 DIAGNOSIS — M25512 Pain in left shoulder: Secondary | ICD-10-CM | POA: Diagnosis not present

## 2018-01-27 DIAGNOSIS — M25612 Stiffness of left shoulder, not elsewhere classified: Secondary | ICD-10-CM | POA: Diagnosis not present

## 2018-01-30 DIAGNOSIS — M25612 Stiffness of left shoulder, not elsewhere classified: Secondary | ICD-10-CM | POA: Diagnosis not present

## 2018-01-30 DIAGNOSIS — M25512 Pain in left shoulder: Secondary | ICD-10-CM | POA: Diagnosis not present

## 2018-01-30 DIAGNOSIS — M7502 Adhesive capsulitis of left shoulder: Secondary | ICD-10-CM | POA: Diagnosis not present

## 2018-02-01 DIAGNOSIS — M25612 Stiffness of left shoulder, not elsewhere classified: Secondary | ICD-10-CM | POA: Diagnosis not present

## 2018-02-01 DIAGNOSIS — M25512 Pain in left shoulder: Secondary | ICD-10-CM | POA: Diagnosis not present

## 2018-02-01 DIAGNOSIS — M7502 Adhesive capsulitis of left shoulder: Secondary | ICD-10-CM | POA: Diagnosis not present

## 2018-02-03 DIAGNOSIS — M25512 Pain in left shoulder: Secondary | ICD-10-CM | POA: Diagnosis not present

## 2018-02-03 DIAGNOSIS — M7502 Adhesive capsulitis of left shoulder: Secondary | ICD-10-CM | POA: Diagnosis not present

## 2018-02-03 DIAGNOSIS — M25612 Stiffness of left shoulder, not elsewhere classified: Secondary | ICD-10-CM | POA: Diagnosis not present

## 2018-02-06 DIAGNOSIS — M7502 Adhesive capsulitis of left shoulder: Secondary | ICD-10-CM | POA: Diagnosis not present

## 2018-02-06 DIAGNOSIS — M25512 Pain in left shoulder: Secondary | ICD-10-CM | POA: Diagnosis not present

## 2018-02-06 DIAGNOSIS — M25612 Stiffness of left shoulder, not elsewhere classified: Secondary | ICD-10-CM | POA: Diagnosis not present

## 2018-02-07 ENCOUNTER — Other Ambulatory Visit: Payer: Self-pay | Admitting: Orthopaedic Surgery

## 2018-02-07 DIAGNOSIS — M25512 Pain in left shoulder: Secondary | ICD-10-CM | POA: Diagnosis not present

## 2018-02-13 ENCOUNTER — Ambulatory Visit
Admission: RE | Admit: 2018-02-13 | Discharge: 2018-02-13 | Disposition: A | Discharge status: Home / Self Care | Payer: BLUE CROSS/BLUE SHIELD | Source: Ambulatory Visit | Attending: Orthopaedic Surgery | Admitting: Orthopaedic Surgery

## 2018-02-13 DIAGNOSIS — M25512 Pain in left shoulder: Secondary | ICD-10-CM

## 2018-02-23 ENCOUNTER — Encounter (HOSPITAL_BASED_OUTPATIENT_CLINIC_OR_DEPARTMENT_OTHER): Payer: Self-pay

## 2018-02-23 ENCOUNTER — Ambulatory Visit (HOSPITAL_BASED_OUTPATIENT_CLINIC_OR_DEPARTMENT_OTHER): Admit: 2018-02-23 | Payer: BLUE CROSS/BLUE SHIELD | Admitting: Orthopaedic Surgery

## 2018-02-23 SURGERY — SHOULDER ARTHROSCOPY WITH DEBRIDEMENT AND BICEP TENDON REPAIR
Anesthesia: Choice | Laterality: Left

## 2019-01-04 DIAGNOSIS — H9209 Otalgia, unspecified ear: Secondary | ICD-10-CM | POA: Diagnosis not present

## 2019-01-04 DIAGNOSIS — R5383 Other fatigue: Secondary | ICD-10-CM | POA: Diagnosis not present

## 2019-02-09 DIAGNOSIS — Z13220 Encounter for screening for lipoid disorders: Secondary | ICD-10-CM | POA: Diagnosis not present

## 2019-02-09 DIAGNOSIS — Z79899 Other long term (current) drug therapy: Secondary | ICD-10-CM | POA: Diagnosis not present

## 2019-02-15 DIAGNOSIS — Z0000 Encounter for general adult medical examination without abnormal findings: Secondary | ICD-10-CM | POA: Diagnosis not present

## 2019-05-16 IMAGING — CT CT ANGIO CHEST
2 of 6 series · 18 of 36 positions shown · IV contrast (Omni 300)
Comparison: Chest radiographs 09/16/2017 and earlier.

CLINICAL DATA: 52-year-old female with shortness of breath, chest
pain and lower extremity swelling for 2 days. Solitary kidney, prior
kidney donor.

EXAM:
CT ANGIOGRAPHY CHEST WITH CONTRAST
TECHNIQUE: Multidetector CT imaging of the chest was performed using the
standard protocol during bolus administration of intravenous
contrast. Multiplanar CT image reconstructions and MIPs were
obtained to evaluate the vascular anatomy.
CONTRAST:  77 milliliters LU7SZT-IDA IOPAMIDOL (LU7SZT-IDA)
INJECTION 76%

[Series 7: pe thins · axial · 0.69mm/px · z∈[+1186,+1434]mm · 17 of 280 slices shown]
[im 16/280  lung]
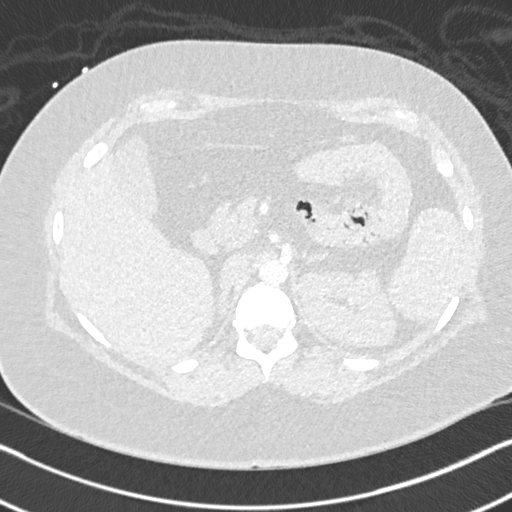
[im 32/280  mediastinal]
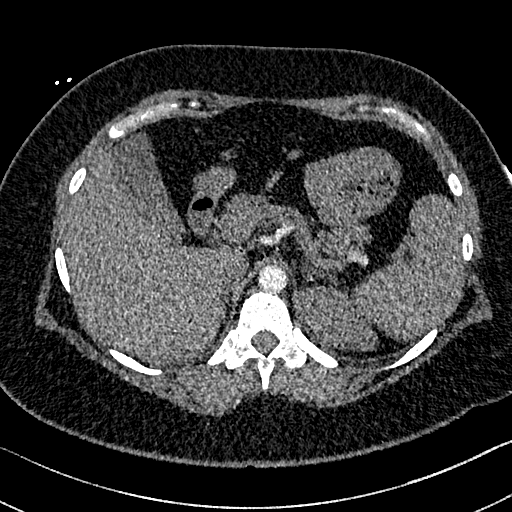
[im 47/280  lung]
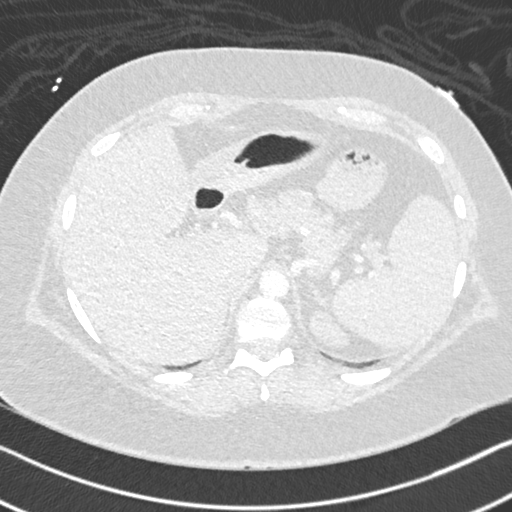
[im 63/280  mediastinal]
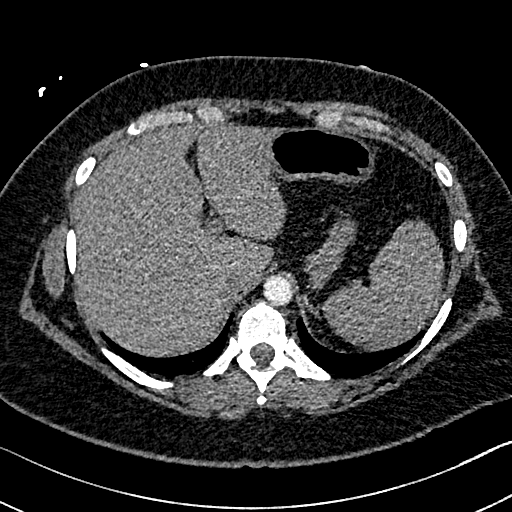
[im 78/280  lung]
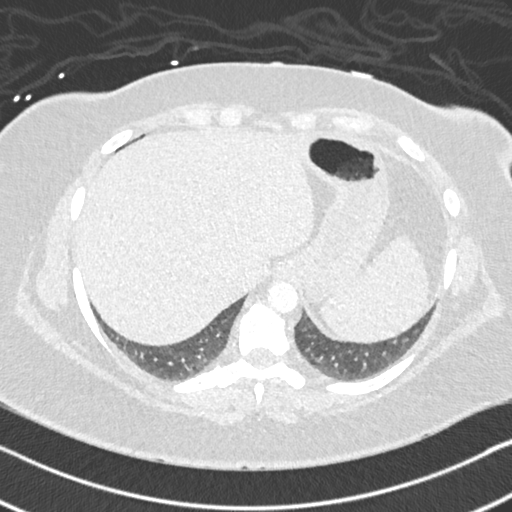
[im 94/280  mediastinal]
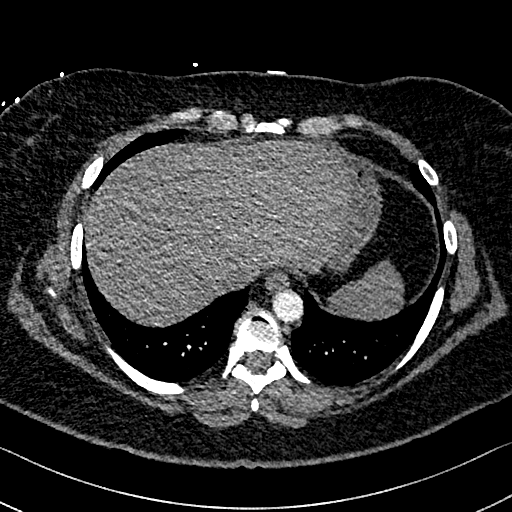
[im 109/280  lung]
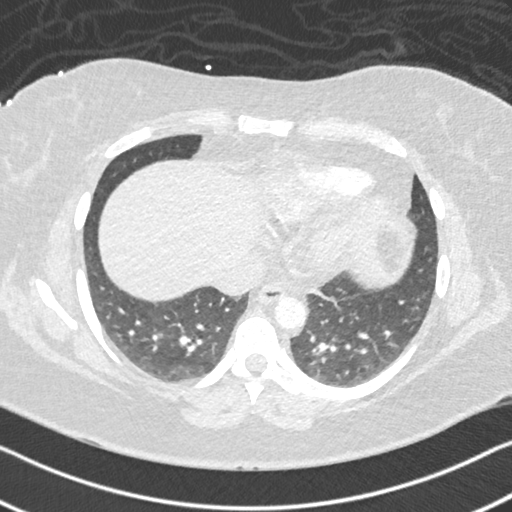
[im 125/280  mediastinal]
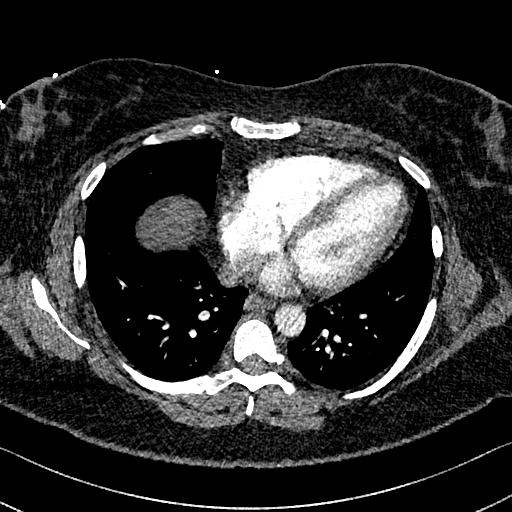
[im 140/280  lung]
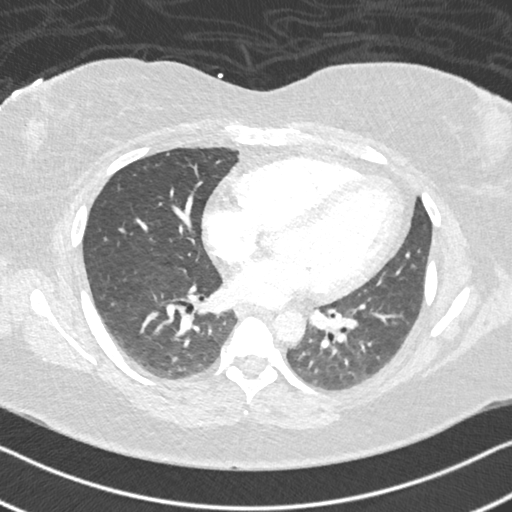
[im 156/280  mediastinal]
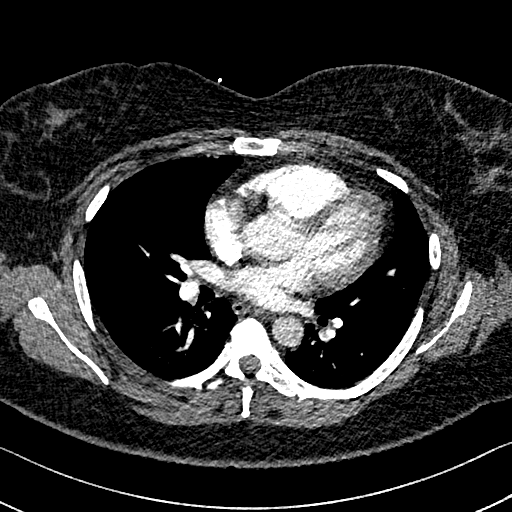
[im 171/280  lung]
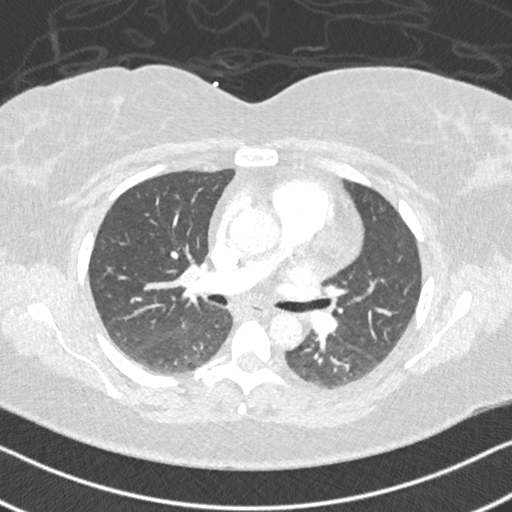
[im 187/280  mediastinal]
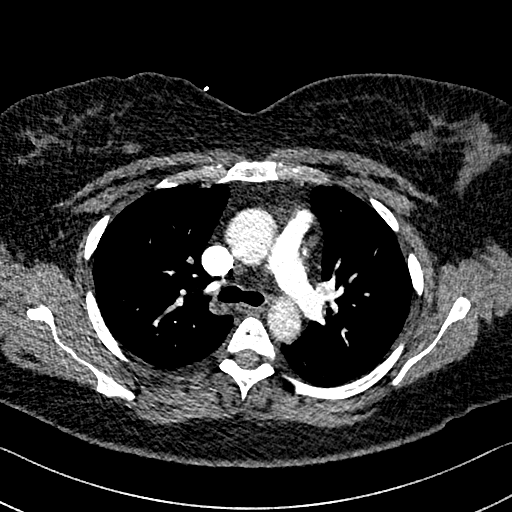
[im 202/280  lung]
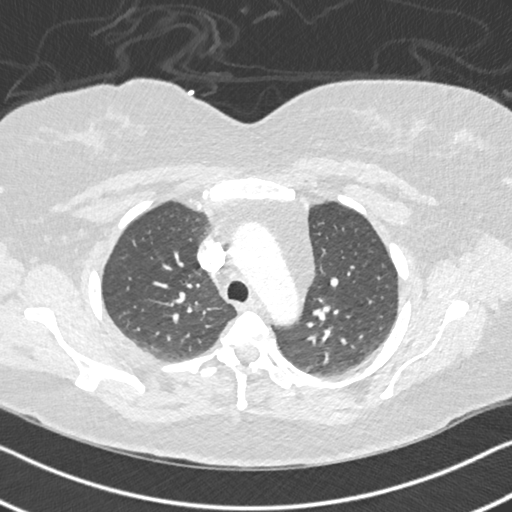
[im 218/280  mediastinal]
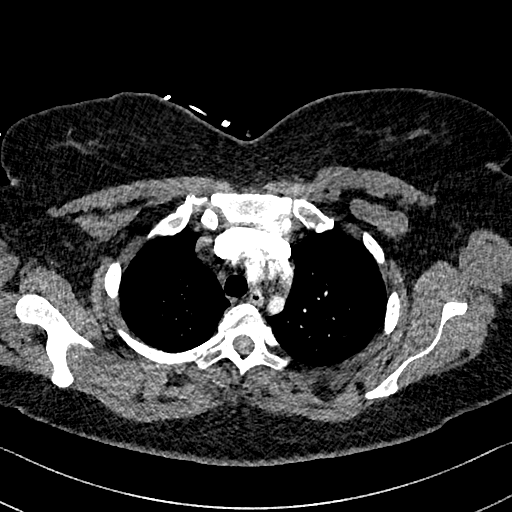
[im 233/280  lung]
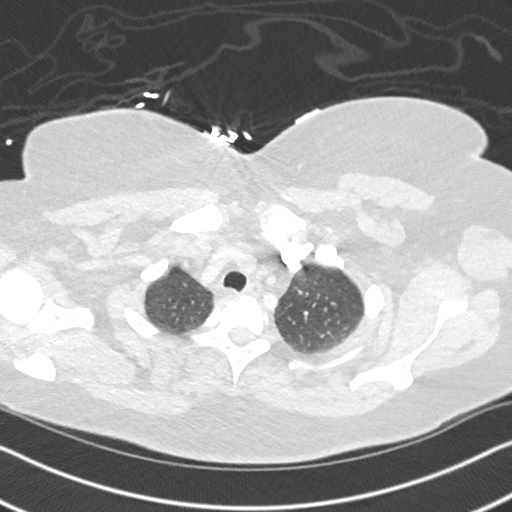
[im 249/280  mediastinal]
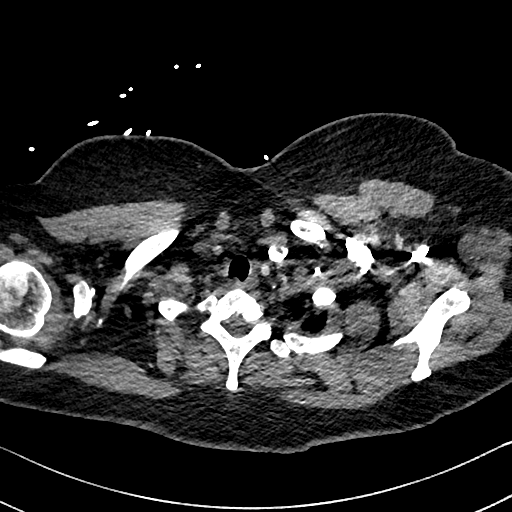
[im 264/280  lung]
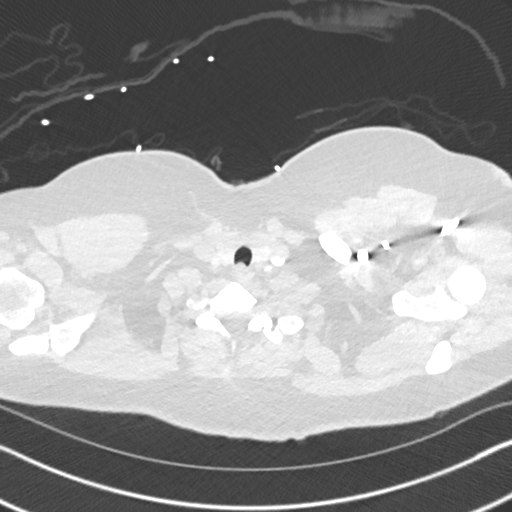

[Series 8: pe 2mm cor · coronal · 0.57mm/px · 1 of 142 slices shown]
[im 71/142  mediastinal]
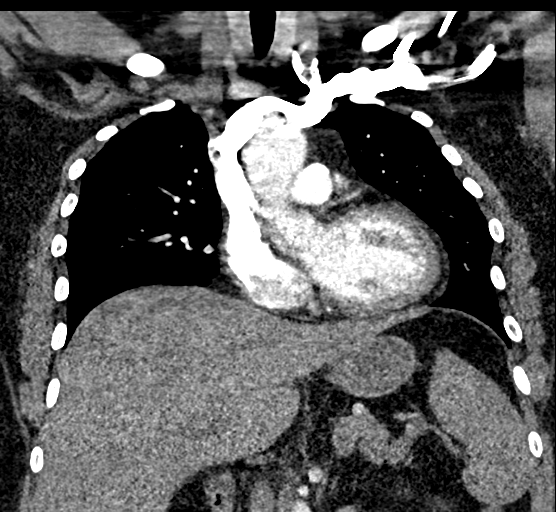

[18 of 36 positions shown; findings below may reference images not displayed]

FINDINGS: Cardiovascular: Adequate contrast bolus timing in the pulmonary
arterial tree.

No focal filling defect identified in the pulmonary arteries to
suggest acute pulmonary embolism.

Cardiac size is at the upper limits of normal to mildly enlarged. No
pericardial effusion. Negative visible aorta.

Mediastinum/Nodes: Negative.  No mediastinal lymphadenopathy.

Lungs/Pleura: Major airways are patent. Mild dependent pulmonary
ground-glass opacity most resembles atelectasis. Otherwise negative
lung parenchyma. No pleural effusion.

Upper Abdomen: Negative visible liver, gallbladder, spleen,
pancreas, adrenal glands, left kidney, and bowel in the upper
abdomen.

Musculoskeletal: Negative.

Review of the MIP images confirms the above findings.
IMPRESSION: Negative for acute pulmonary embolus. Negative chest CTA aside from
borderline to mild cardiomegaly.

## 2020-02-21 ENCOUNTER — Other Ambulatory Visit: Payer: Self-pay | Admitting: Family Medicine

## 2020-02-21 ENCOUNTER — Other Ambulatory Visit (HOSPITAL_COMMUNITY)
Admission: RE | Admit: 2020-02-21 | Discharge: 2020-02-21 | Disposition: A | Discharge status: Home / Self Care | Payer: BLUE CROSS/BLUE SHIELD | Source: Ambulatory Visit | Attending: Family Medicine | Admitting: Family Medicine

## 2020-02-21 DIAGNOSIS — Z124 Encounter for screening for malignant neoplasm of cervix: Secondary | ICD-10-CM | POA: Diagnosis not present

## 2020-02-21 DIAGNOSIS — Z13220 Encounter for screening for lipoid disorders: Secondary | ICD-10-CM | POA: Diagnosis not present

## 2020-02-21 DIAGNOSIS — Z131 Encounter for screening for diabetes mellitus: Secondary | ICD-10-CM | POA: Diagnosis not present

## 2020-02-21 DIAGNOSIS — Z0000 Encounter for general adult medical examination without abnormal findings: Secondary | ICD-10-CM | POA: Diagnosis not present

## 2020-02-21 DIAGNOSIS — Z1231 Encounter for screening mammogram for malignant neoplasm of breast: Secondary | ICD-10-CM

## 2020-02-21 DIAGNOSIS — E669 Obesity, unspecified: Secondary | ICD-10-CM | POA: Diagnosis not present

## 2020-02-21 DIAGNOSIS — D500 Iron deficiency anemia secondary to blood loss (chronic): Secondary | ICD-10-CM | POA: Diagnosis not present

## 2020-02-22 LAB — CYTOLOGY - PAP
Comment: NEGATIVE
Diagnosis: NEGATIVE
High risk HPV: NEGATIVE

## 2020-04-04 DIAGNOSIS — Z20822 Contact with and (suspected) exposure to covid-19: Secondary | ICD-10-CM | POA: Diagnosis not present

## 2020-11-11 DIAGNOSIS — Z1283 Encounter for screening for malignant neoplasm of skin: Secondary | ICD-10-CM | POA: Diagnosis not present

## 2020-11-11 DIAGNOSIS — L820 Inflamed seborrheic keratosis: Secondary | ICD-10-CM | POA: Diagnosis not present

## 2020-11-11 DIAGNOSIS — D225 Melanocytic nevi of trunk: Secondary | ICD-10-CM | POA: Diagnosis not present

## 2022-07-14 DIAGNOSIS — R509 Fever, unspecified: Secondary | ICD-10-CM | POA: Diagnosis not present

## 2022-07-14 DIAGNOSIS — Z03818 Encounter for observation for suspected exposure to other biological agents ruled out: Secondary | ICD-10-CM | POA: Diagnosis not present

## 2022-07-14 DIAGNOSIS — J101 Influenza due to other identified influenza virus with other respiratory manifestations: Secondary | ICD-10-CM | POA: Diagnosis not present
